# Patient Record
Sex: Male | Born: 1935 | Race: Black or African American | Hispanic: No
Health system: Southern US, Community
[De-identification: ages and names within clinical notes are randomized; demographics above are authoritative.]

## PROBLEM LIST (undated history)

## (undated) DIAGNOSIS — J189 Pneumonia, unspecified organism: Secondary | ICD-10-CM

## (undated) DIAGNOSIS — J449 Chronic obstructive pulmonary disease, unspecified: Secondary | ICD-10-CM

## (undated) DIAGNOSIS — N183 Chronic kidney disease, stage 3 unspecified: Secondary | ICD-10-CM

## (undated) DIAGNOSIS — R001 Bradycardia, unspecified: Secondary | ICD-10-CM

## (undated) DIAGNOSIS — J9621 Acute and chronic respiratory failure with hypoxia: Secondary | ICD-10-CM

## (undated) DIAGNOSIS — M4624 Osteomyelitis of vertebra, thoracic region: Secondary | ICD-10-CM

---

## 2006-07-05 ENCOUNTER — Encounter: Payer: Self-pay | Admitting: Orthopedic Surgery

## 2007-01-19 ENCOUNTER — Ambulatory Visit: Payer: Self-pay | Admitting: Orthopedic Surgery

## 2007-01-19 DIAGNOSIS — M2350 Chronic instability of knee, unspecified knee: Secondary | ICD-10-CM

## 2007-01-19 DIAGNOSIS — Z8679 Personal history of other diseases of the circulatory system: Secondary | ICD-10-CM | POA: Insufficient documentation

## 2007-02-01 ENCOUNTER — Encounter: Payer: Self-pay | Admitting: Orthopedic Surgery

## 2007-03-04 ENCOUNTER — Encounter: Payer: Self-pay | Admitting: Orthopedic Surgery

## 2018-07-25 ENCOUNTER — Other Ambulatory Visit (HOSPITAL_COMMUNITY): Payer: Self-pay

## 2018-07-25 ENCOUNTER — Inpatient Hospital Stay
Admission: AD | Admit: 2018-07-25 | Discharge: 2018-08-22 | Disposition: A | Discharge status: Hospice | Payer: Medicare Other | Source: Other Acute Inpatient Hospital | Attending: Internal Medicine | Admitting: Internal Medicine

## 2018-07-25 DIAGNOSIS — J9621 Acute and chronic respiratory failure with hypoxia: Secondary | ICD-10-CM | POA: Diagnosis present

## 2018-07-25 DIAGNOSIS — Z9289 Personal history of other medical treatment: Secondary | ICD-10-CM

## 2018-07-25 DIAGNOSIS — J984 Other disorders of lung: Secondary | ICD-10-CM

## 2018-07-25 DIAGNOSIS — R001 Bradycardia, unspecified: Secondary | ICD-10-CM | POA: Diagnosis present

## 2018-07-25 DIAGNOSIS — J9690 Respiratory failure, unspecified, unspecified whether with hypoxia or hypercapnia: Secondary | ICD-10-CM

## 2018-07-25 DIAGNOSIS — J189 Pneumonia, unspecified organism: Secondary | ICD-10-CM | POA: Diagnosis present

## 2018-07-25 DIAGNOSIS — Z4659 Encounter for fitting and adjustment of other gastrointestinal appliance and device: Secondary | ICD-10-CM

## 2018-07-25 DIAGNOSIS — N183 Chronic kidney disease, stage 3 unspecified: Secondary | ICD-10-CM | POA: Diagnosis present

## 2018-07-25 DIAGNOSIS — Z931 Gastrostomy status: Secondary | ICD-10-CM

## 2018-07-25 DIAGNOSIS — Z9911 Dependence on respirator [ventilator] status: Secondary | ICD-10-CM

## 2018-07-25 DIAGNOSIS — M4624 Osteomyelitis of vertebra, thoracic region: Secondary | ICD-10-CM | POA: Diagnosis present

## 2018-07-25 DIAGNOSIS — J449 Chronic obstructive pulmonary disease, unspecified: Secondary | ICD-10-CM | POA: Diagnosis present

## 2018-07-25 HISTORY — DX: Chronic kidney disease, stage 3 unspecified: N18.30

## 2018-07-25 HISTORY — DX: Osteomyelitis of vertebra, thoracic region: M46.24

## 2018-07-25 HISTORY — DX: Acute and chronic respiratory failure with hypoxia: J96.21

## 2018-07-25 HISTORY — DX: Bradycardia, unspecified: R00.1

## 2018-07-25 HISTORY — DX: Chronic obstructive pulmonary disease, unspecified: J44.9

## 2018-07-25 HISTORY — DX: Pneumonia, unspecified organism: J18.9

## 2018-07-25 LAB — COMPREHENSIVE METABOLIC PANEL
ALT: 15 U/L (ref 0–44)
AST: 18 U/L (ref 15–41)
Albumin: 2.2 g/dL — ABNORMAL LOW (ref 3.5–5.0)
Alkaline Phosphatase: 50 U/L (ref 38–126)
Anion gap: 7 (ref 5–15)
BUN: 11 mg/dL (ref 8–23)
CO2: 21 mmol/L — ABNORMAL LOW (ref 22–32)
Calcium: 9.2 mg/dL (ref 8.9–10.3)
Chloride: 111 mmol/L (ref 98–111)
Creatinine, Ser: 1.44 mg/dL — ABNORMAL HIGH (ref 0.61–1.24)
GFR calc Af Amer: 50 mL/min — ABNORMAL LOW (ref >60)
GFR calc non Af Amer: 43 mL/min — ABNORMAL LOW (ref >60)
Glucose, Bld: 80 mg/dL (ref 70–99)
Potassium: 5.1 mmol/L (ref 3.5–5.1)
Sodium: 139 mmol/L (ref 135–145)
Total Bilirubin: 0.6 mg/dL (ref 0.3–1.2)
Total Protein: 5.5 g/dL — ABNORMAL LOW (ref 6.5–8.1)

## 2018-07-26 LAB — CBC WITH DIFFERENTIAL/PLATELET
Abs Immature Granulocytes: 0.04 10*3/uL (ref 0.00–0.07)
Basophils Absolute: 0 10*3/uL (ref 0.0–0.1)
Basophils Relative: 0 %
Eosinophils Absolute: 0.2 10*3/uL (ref 0.0–0.5)
Eosinophils Relative: 4 %
HCT: 32.2 % — ABNORMAL LOW (ref 39.0–52.0)
Hemoglobin: 10.5 g/dL — ABNORMAL LOW (ref 13.0–17.0)
Immature Granulocytes: 1 %
Lymphocytes Relative: 32 %
Lymphs Abs: 1.7 10*3/uL (ref 0.7–4.0)
MCH: 33 pg (ref 26.0–34.0)
MCHC: 32.6 g/dL (ref 30.0–36.0)
MCV: 101.3 fL — ABNORMAL HIGH (ref 80.0–100.0)
Monocytes Absolute: 0.5 10*3/uL (ref 0.1–1.0)
Monocytes Relative: 10 %
Neutro Abs: 2.8 10*3/uL (ref 1.7–7.7)
Neutrophils Relative %: 53 %
Platelets: 85 10*3/uL — ABNORMAL LOW (ref 150–400)
RBC: 3.18 MIL/uL — ABNORMAL LOW (ref 4.22–5.81)
RDW: 18.2 % — ABNORMAL HIGH (ref 11.5–15.5)
WBC: 5.2 10*3/uL (ref 4.0–10.5)
nRBC: 0 % (ref 0.0–0.2)

## 2018-07-26 LAB — TSH: TSH: 9.096 u[IU]/mL — ABNORMAL HIGH (ref 0.350–4.500)

## 2018-07-26 LAB — PHOSPHORUS: Phosphorus: 3.3 mg/dL (ref 2.5–4.6)

## 2018-07-26 LAB — PROTIME-INR
INR: 1.1 (ref 0.8–1.2)
Prothrombin Time: 14.2 seconds (ref 11.4–15.2)

## 2018-07-26 LAB — HEMOGLOBIN A1C
Hgb A1c MFr Bld: 5.6 % (ref 4.8–5.6)
Mean Plasma Glucose: 114.02 mg/dL

## 2018-07-26 LAB — MAGNESIUM: Magnesium: 1.6 mg/dL — ABNORMAL LOW (ref 1.7–2.4)

## 2018-07-26 LAB — VANCOMYCIN, TROUGH: Vancomycin Tr: 17 ug/mL (ref 15–20)

## 2018-07-27 LAB — RENAL FUNCTION PANEL
Albumin: 2 g/dL — ABNORMAL LOW (ref 3.5–5.0)
Anion gap: 7 (ref 5–15)
BUN: 18 mg/dL (ref 8–23)
CO2: 23 mmol/L (ref 22–32)
Calcium: 9.1 mg/dL (ref 8.9–10.3)
Chloride: 108 mmol/L (ref 98–111)
Creatinine, Ser: 1.24 mg/dL (ref 0.61–1.24)
GFR calc Af Amer: 59 mL/min — ABNORMAL LOW (ref >60)
GFR calc non Af Amer: 51 mL/min — ABNORMAL LOW (ref >60)
Glucose, Bld: 83 mg/dL (ref 70–99)
Phosphorus: 3.5 mg/dL (ref 2.5–4.6)
Potassium: 4.9 mmol/L (ref 3.5–5.1)
Sodium: 138 mmol/L (ref 135–145)

## 2018-07-27 LAB — CBC
HCT: 29.1 % — ABNORMAL LOW (ref 39.0–52.0)
Hemoglobin: 9.7 g/dL — ABNORMAL LOW (ref 13.0–17.0)
MCH: 33 pg (ref 26.0–34.0)
MCHC: 33.3 g/dL (ref 30.0–36.0)
MCV: 99 fL (ref 80.0–100.0)
Platelets: 100 10*3/uL — ABNORMAL LOW (ref 150–400)
RBC: 2.94 MIL/uL — ABNORMAL LOW (ref 4.22–5.81)
RDW: 18 % — ABNORMAL HIGH (ref 11.5–15.5)
WBC: 4.8 10*3/uL (ref 4.0–10.5)
nRBC: 0 % (ref 0.0–0.2)

## 2018-07-27 LAB — MAGNESIUM: Magnesium: 2 mg/dL (ref 1.7–2.4)

## 2018-07-30 ENCOUNTER — Other Ambulatory Visit (HOSPITAL_COMMUNITY): Payer: Self-pay

## 2018-08-01 LAB — RENAL FUNCTION PANEL
Albumin: 2.4 g/dL — ABNORMAL LOW (ref 3.5–5.0)
Anion gap: 11 (ref 5–15)
BUN: 31 mg/dL — ABNORMAL HIGH (ref 8–23)
CO2: 26 mmol/L (ref 22–32)
Calcium: 9.1 mg/dL (ref 8.9–10.3)
Chloride: 102 mmol/L (ref 98–111)
Creatinine, Ser: 1.3 mg/dL — ABNORMAL HIGH (ref 0.61–1.24)
GFR calc Af Amer: 56 mL/min — ABNORMAL LOW (ref >60)
GFR calc non Af Amer: 48 mL/min — ABNORMAL LOW (ref >60)
Glucose, Bld: 99 mg/dL (ref 70–99)
Phosphorus: 3.2 mg/dL (ref 2.5–4.6)
Potassium: 4.2 mmol/L (ref 3.5–5.1)
Sodium: 139 mmol/L (ref 135–145)

## 2018-08-01 LAB — CBC
HCT: 32.2 % — ABNORMAL LOW (ref 39.0–52.0)
Hemoglobin: 10.6 g/dL — ABNORMAL LOW (ref 13.0–17.0)
MCH: 32.7 pg (ref 26.0–34.0)
MCHC: 32.9 g/dL (ref 30.0–36.0)
MCV: 99.4 fL (ref 80.0–100.0)
Platelets: 143 10*3/uL — ABNORMAL LOW (ref 150–400)
RBC: 3.24 MIL/uL — ABNORMAL LOW (ref 4.22–5.81)
RDW: 17.3 % — ABNORMAL HIGH (ref 11.5–15.5)
WBC: 10.3 10*3/uL (ref 4.0–10.5)
nRBC: 0 % (ref 0.0–0.2)

## 2018-08-01 LAB — MAGNESIUM: Magnesium: 2 mg/dL (ref 1.7–2.4)

## 2018-08-02 ENCOUNTER — Other Ambulatory Visit (HOSPITAL_COMMUNITY): Payer: Self-pay

## 2018-08-04 LAB — BASIC METABOLIC PANEL
Anion gap: 9 (ref 5–15)
BUN: 43 mg/dL — ABNORMAL HIGH (ref 8–23)
CO2: 28 mmol/L (ref 22–32)
Calcium: 9 mg/dL (ref 8.9–10.3)
Chloride: 103 mmol/L (ref 98–111)
Creatinine, Ser: 1.5 mg/dL — ABNORMAL HIGH (ref 0.61–1.24)
GFR calc Af Amer: 47 mL/min — ABNORMAL LOW (ref >60)
GFR calc non Af Amer: 41 mL/min — ABNORMAL LOW (ref >60)
Glucose, Bld: 67 mg/dL — ABNORMAL LOW (ref 70–99)
Potassium: 3.9 mmol/L (ref 3.5–5.1)
Sodium: 140 mmol/L (ref 135–145)

## 2018-08-05 LAB — BASIC METABOLIC PANEL
Anion gap: 9 (ref 5–15)
BUN: 40 mg/dL — ABNORMAL HIGH (ref 8–23)
CO2: 28 mmol/L (ref 22–32)
Calcium: 8.9 mg/dL (ref 8.9–10.3)
Chloride: 105 mmol/L (ref 98–111)
Creatinine, Ser: 1.28 mg/dL — ABNORMAL HIGH (ref 0.61–1.24)
GFR calc Af Amer: 57 mL/min — ABNORMAL LOW (ref >60)
GFR calc non Af Amer: 49 mL/min — ABNORMAL LOW (ref >60)
Glucose, Bld: 114 mg/dL — ABNORMAL HIGH (ref 70–99)
Potassium: 3.2 mmol/L — ABNORMAL LOW (ref 3.5–5.1)
Sodium: 142 mmol/L (ref 135–145)

## 2018-08-05 LAB — CBC
HCT: 30.5 % — ABNORMAL LOW (ref 39.0–52.0)
Hemoglobin: 10.1 g/dL — ABNORMAL LOW (ref 13.0–17.0)
MCH: 33.3 pg (ref 26.0–34.0)
MCHC: 33.1 g/dL (ref 30.0–36.0)
MCV: 100.7 fL — ABNORMAL HIGH (ref 80.0–100.0)
Platelets: 151 10*3/uL (ref 150–400)
RBC: 3.03 MIL/uL — ABNORMAL LOW (ref 4.22–5.81)
RDW: 17.4 % — ABNORMAL HIGH (ref 11.5–15.5)
WBC: 5.8 10*3/uL (ref 4.0–10.5)
nRBC: 0 % (ref 0.0–0.2)

## 2018-08-05 LAB — MAGNESIUM: Magnesium: 2 mg/dL (ref 1.7–2.4)

## 2018-08-06 LAB — POTASSIUM: Potassium: 3.6 mmol/L (ref 3.5–5.1)

## 2018-08-06 LAB — TSH: TSH: 6.643 u[IU]/mL — ABNORMAL HIGH (ref 0.350–4.500)

## 2018-08-08 ENCOUNTER — Other Ambulatory Visit (HOSPITAL_COMMUNITY): Payer: Self-pay

## 2018-08-08 LAB — CBC
HCT: 34.6 % — ABNORMAL LOW (ref 39.0–52.0)
HCT: 29.3 % — ABNORMAL LOW (ref 39.0–52.0)
Hemoglobin: 11.7 g/dL — ABNORMAL LOW (ref 13.0–17.0)
Hemoglobin: 9.5 g/dL — ABNORMAL LOW (ref 13.0–17.0)
MCH: 33.6 pg (ref 26.0–34.0)
MCH: 33 pg (ref 26.0–34.0)
MCHC: 33.8 g/dL (ref 30.0–36.0)
MCHC: 32.4 g/dL (ref 30.0–36.0)
MCV: 99.4 fL (ref 80.0–100.0)
MCV: 101.7 fL — ABNORMAL HIGH (ref 80.0–100.0)
Platelets: 172 10*3/uL (ref 150–400)
Platelets: 165 10*3/uL (ref 150–400)
RBC: 3.48 MIL/uL — ABNORMAL LOW (ref 4.22–5.81)
RBC: 2.88 MIL/uL — ABNORMAL LOW (ref 4.22–5.81)
RDW: 17.5 % — ABNORMAL HIGH (ref 11.5–15.5)
RDW: 17.5 % — ABNORMAL HIGH (ref 11.5–15.5)
WBC: 5.7 10*3/uL (ref 4.0–10.5)
WBC: 5 10*3/uL (ref 4.0–10.5)
nRBC: 0 % (ref 0.0–0.2)
nRBC: 0 % (ref 0.0–0.2)

## 2018-08-08 LAB — MAGNESIUM
Magnesium: 1.8 mg/dL (ref 1.7–2.4)
Magnesium: 1.9 mg/dL (ref 1.7–2.4)

## 2018-08-08 LAB — CK: Total CK: 79 U/L (ref 49–397)

## 2018-08-08 LAB — BASIC METABOLIC PANEL
Anion gap: 10 (ref 5–15)
Anion gap: 9 (ref 5–15)
BUN: 23 mg/dL (ref 8–23)
BUN: 26 mg/dL — ABNORMAL HIGH (ref 8–23)
CO2: 27 mmol/L (ref 22–32)
CO2: 26 mmol/L (ref 22–32)
Calcium: 9.1 mg/dL (ref 8.9–10.3)
Calcium: 8.7 mg/dL — ABNORMAL LOW (ref 8.9–10.3)
Chloride: 104 mmol/L (ref 98–111)
Chloride: 105 mmol/L (ref 98–111)
Creatinine, Ser: 1.06 mg/dL (ref 0.61–1.24)
Creatinine, Ser: 1.33 mg/dL — ABNORMAL HIGH (ref 0.61–1.24)
GFR calc Af Amer: >60 mL/min (ref >60)
GFR calc Af Amer: 55 mL/min — ABNORMAL LOW (ref >60)
GFR calc non Af Amer: >60 mL/min (ref >60)
GFR calc non Af Amer: 47 mL/min — ABNORMAL LOW (ref >60)
Glucose, Bld: 149 mg/dL — ABNORMAL HIGH (ref 70–99)
Glucose, Bld: 166 mg/dL — ABNORMAL HIGH (ref 70–99)
Potassium: 3.7 mmol/L (ref 3.5–5.1)
Potassium: 4 mmol/L (ref 3.5–5.1)
Sodium: 141 mmol/L (ref 135–145)
Sodium: 140 mmol/L (ref 135–145)

## 2018-08-08 LAB — TROPONIN I (HIGH SENSITIVITY): Troponin I (High Sensitivity): 24 ng/L — ABNORMAL HIGH (ref <18)

## 2018-08-08 MED FILL — Medication: Qty: 1 | Status: AC

## 2018-08-09 ENCOUNTER — Encounter: Payer: Self-pay | Admitting: Internal Medicine

## 2018-08-09 ENCOUNTER — Institutional Professional Consult (permissible substitution) (HOSPITAL_COMMUNITY): Payer: Self-pay

## 2018-08-09 ENCOUNTER — Other Ambulatory Visit (HOSPITAL_COMMUNITY): Payer: Self-pay

## 2018-08-09 DIAGNOSIS — M4624 Osteomyelitis of vertebra, thoracic region: Secondary | ICD-10-CM

## 2018-08-09 DIAGNOSIS — R001 Bradycardia, unspecified: Secondary | ICD-10-CM | POA: Diagnosis not present

## 2018-08-09 DIAGNOSIS — N183 Chronic kidney disease, stage 3 unspecified: Secondary | ICD-10-CM | POA: Diagnosis present

## 2018-08-09 DIAGNOSIS — J9621 Acute and chronic respiratory failure with hypoxia: Secondary | ICD-10-CM | POA: Diagnosis not present

## 2018-08-09 DIAGNOSIS — J189 Pneumonia, unspecified organism: Secondary | ICD-10-CM

## 2018-08-09 DIAGNOSIS — J449 Chronic obstructive pulmonary disease, unspecified: Secondary | ICD-10-CM

## 2018-08-09 LAB — CBC
HCT: 29.9 % — ABNORMAL LOW (ref 39.0–52.0)
HCT: 28.7 % — ABNORMAL LOW (ref 39.0–52.0)
Hemoglobin: 9.9 g/dL — ABNORMAL LOW (ref 13.0–17.0)
Hemoglobin: 9.1 g/dL — ABNORMAL LOW (ref 13.0–17.0)
MCH: 33.6 pg (ref 26.0–34.0)
MCH: 33.1 pg (ref 26.0–34.0)
MCHC: 33.1 g/dL (ref 30.0–36.0)
MCHC: 31.7 g/dL (ref 30.0–36.0)
MCV: 101.4 fL — ABNORMAL HIGH (ref 80.0–100.0)
MCV: 104.4 fL — ABNORMAL HIGH (ref 80.0–100.0)
Platelets: 185 10*3/uL (ref 150–400)
Platelets: 153 10*3/uL (ref 150–400)
RBC: 2.95 MIL/uL — ABNORMAL LOW (ref 4.22–5.81)
RBC: 2.75 MIL/uL — ABNORMAL LOW (ref 4.22–5.81)
RDW: 18 % — ABNORMAL HIGH (ref 11.5–15.5)
RDW: 18 % — ABNORMAL HIGH (ref 11.5–15.5)
WBC: 6.1 10*3/uL (ref 4.0–10.5)
WBC: 6.8 10*3/uL (ref 4.0–10.5)
nRBC: 0 % (ref 0.0–0.2)
nRBC: 0 % (ref 0.0–0.2)

## 2018-08-09 LAB — RENAL FUNCTION PANEL
Albumin: 2.4 g/dL — ABNORMAL LOW (ref 3.5–5.0)
Albumin: 2.3 g/dL — ABNORMAL LOW (ref 3.5–5.0)
Anion gap: 11 (ref 5–15)
Anion gap: 9 (ref 5–15)
BUN: 37 mg/dL — ABNORMAL HIGH (ref 8–23)
BUN: 41 mg/dL — ABNORMAL HIGH (ref 8–23)
CO2: 23 mmol/L (ref 22–32)
CO2: 25 mmol/L (ref 22–32)
Calcium: 8.7 mg/dL — ABNORMAL LOW (ref 8.9–10.3)
Calcium: 8.6 mg/dL — ABNORMAL LOW (ref 8.9–10.3)
Chloride: 106 mmol/L (ref 98–111)
Chloride: 106 mmol/L (ref 98–111)
Creatinine, Ser: 1.59 mg/dL — ABNORMAL HIGH (ref 0.61–1.24)
Creatinine, Ser: 1.73 mg/dL — ABNORMAL HIGH (ref 0.61–1.24)
GFR calc Af Amer: 44 mL/min — ABNORMAL LOW (ref >60)
GFR calc Af Amer: 40 mL/min — ABNORMAL LOW (ref >60)
GFR calc non Af Amer: 38 mL/min — ABNORMAL LOW (ref >60)
GFR calc non Af Amer: 34 mL/min — ABNORMAL LOW (ref >60)
Glucose, Bld: 164 mg/dL — ABNORMAL HIGH (ref 70–99)
Glucose, Bld: 167 mg/dL — ABNORMAL HIGH (ref 70–99)
Phosphorus: 3.7 mg/dL (ref 2.5–4.6)
Phosphorus: 3.7 mg/dL (ref 2.5–4.6)
Potassium: 5.1 mmol/L (ref 3.5–5.1)
Potassium: 4 mmol/L (ref 3.5–5.1)
Sodium: 140 mmol/L (ref 135–145)
Sodium: 140 mmol/L (ref 135–145)

## 2018-08-09 LAB — PROTIME-INR
INR: 1.2 (ref 0.8–1.2)
Prothrombin Time: 15.1 seconds (ref 11.4–15.2)

## 2018-08-09 LAB — URINALYSIS, ROUTINE W REFLEX MICROSCOPIC
Bilirubin Urine: NEGATIVE
Glucose, UA: NEGATIVE mg/dL
Hgb urine dipstick: NEGATIVE
Ketones, ur: NEGATIVE mg/dL
Nitrite: NEGATIVE
Protein, ur: 30 mg/dL — AB
Specific Gravity, Urine: 1.015 (ref 1.005–1.030)
WBC, UA: >50 WBC/hpf — ABNORMAL HIGH (ref 0–5)
pH: 6 (ref 5.0–8.0)

## 2018-08-09 LAB — LACTIC ACID, PLASMA
Lactic Acid, Venous: 2 mmol/L (ref 0.5–1.9)
Lactic Acid, Venous: 2.1 mmol/L (ref 0.5–1.9)
Lactic Acid, Venous: 2.8 mmol/L (ref 0.5–1.9)
Lactic Acid, Venous: 2 mmol/L (ref 0.5–1.9)

## 2018-08-09 LAB — APTT: aPTT: 42 s — ABNORMAL HIGH (ref 24–36)

## 2018-08-09 LAB — TROPONIN I (HIGH SENSITIVITY)
Troponin I (High Sensitivity): 35 ng/L — ABNORMAL HIGH (ref <18)
Troponin I (High Sensitivity): 31 ng/L — ABNORMAL HIGH (ref <18)
Troponin I (High Sensitivity): 28 ng/L — ABNORMAL HIGH (ref <18)

## 2018-08-09 LAB — MAGNESIUM
Magnesium: 2 mg/dL (ref 1.7–2.4)
Magnesium: 2 mg/dL (ref 1.7–2.4)

## 2018-08-09 LAB — HEPARIN LEVEL (UNFRACTIONATED)
Heparin Unfractionated: 0.23 [IU]/mL — ABNORMAL LOW (ref 0.30–0.70)
Heparin Unfractionated: 0.64 IU/mL (ref 0.30–0.70)

## 2018-08-09 LAB — CK: Total CK: 111 U/L (ref 49–397)

## 2018-08-09 NOTE — Progress Notes (Signed)
Echo attempted. Non diagnostic due to patient body habitus. Nurse notified.

## 2018-08-09 NOTE — Consult Note (Signed)
Pulmonary [place]  PULMONARY SERVICE  Date of Service: 08/09/2018  PULMONARY CRITICAL CARE CONSULT   Fernando Valdez  NTI:[id number]  DOB: [birth year]   DOA: 07/25/2018  Referring Physician: Merton Border, MD  HPI: Fernando Valdez is a 85 y.o. male seen for follow up of Acute on Chronic Respiratory Failure.  Patient is multiple medical problems including COPD stroke hypertension type 2 diabetes chronic renal failure presented originally to the hospital with hypotension bradycardia confusion and altered mental status.  At that time initial evaluation patient was found to be septic shock patient was started on levo fed as well as pressors eventually was able to wean off the pressors and was transferred to our facility for further management.  Overnight patient subsequently had a rapid response #7 bradycardia now is intubated on the ventilator patient is assist control right now on 30% FiO2.  Review of Systems:  ROS performed and is unremarkable other than noted above.  Past medical history: COPD Stroke Hypertension Type 2 diabetes Chronic renal failure Bradycardia Hypotension Sepsis Acute  Past surgical history: Patient is not able to provide  Allergies: No known drug allergies reported  Family history: No significant family history  Medications: Reviewed on Rounds  Physical Exam:  Vitals: Temperature 98.4 pulse 84 respiratory rate 17 blood pressure is 129/51 saturations 100%  Ventilator Settings mode ventilation assist control FiO2 30% tidal volume 481 PEEP 5  . General: Comfortable at this time . Eyes: Grossly normal lids, irises & conjunctiva . ENT: grossly tongue is normal . Neck: no obvious mass . Cardiovascular: S1-S2 normal no gallop or rub . Respiratory: No rhonchi no rales are noted at this time . Abdomen: Soft nontender . Skin: no rash seen on limited exam . Musculoskeletal: not rigid . Psychiatric:unable to  assess . Neurologic: no seizure no involuntary movements         Labs on Admission:  Basic Metabolic Panel: Recent Labs  Lab 08/04/18 0609 08/05/18 0654 08/06/18 0630 08/08/18 0843 08/08/18 1501 08/09/18 0103  NA 140 142  --  141 140 140  K 3.9 3.2* 3.6 3.7 4.0 5.1  CL 103 105  --  104 105 106  CO2 28 28  --  27 26 23   GLUCOSE 67* 114*  --  149* 166* 164*  BUN 43* 40*  --  23 26* 37*  CREATININE 1.50* 1.28*  --  1.06 1.33* 1.59*  CALCIUM 9.0 8.9  --  9.1 8.7* 8.7*  MG  --  2.0  --  1.8 1.9 2.0  PHOS  --   --   --   --   --  3.7    Recent Labs  Lab 08/08/18 1528  PHART 7.358  PCO2ART 42.6  PO2ART 143*  HCO3 23.3  O2SAT 98.7    Liver Function Tests: Recent Labs  Lab 08/09/18 0103  ALBUMIN 2.4*   No results for input(s): LIPASE, AMYLASE in the last 168 hours. No results for input(s): AMMONIA in the last 168 hours.  CBC: Recent Labs  Lab 08/05/18 0654 08/08/18 0843 08/08/18 1501 08/09/18 0103  WBC 5.8 5.7 5.0 6.1  HGB 10.1* 11.7* 9.5* 9.9*  HCT 30.5* 34.6* 29.3* 29.9*  MCV 100.7* 99.4 101.7* 101.4*  PLT 151 172 165 185    Cardiac Enzymes: Recent Labs  Lab 08/08/18 2141  CKTOTAL 79    BNP (last 3 results) No results for input(s): BNP in the last 8760 hours.  ProBNP (last 3 results) No  results for input(s): PROBNP in the last 8760 hours.   Radiological Exams on Admission: Dg Abd 1 View  Result Date: 08/08/2018 CLINICAL DATA:  Nasogastric tube placement EXAM: ABDOMEN - 1 VIEW COMPARISON:  Radiograph 07/30/2018. FINDINGS: Transesophageal tube tip and side port distal to the GE junction. Additional support devices overlie the chest. Right basilar atelectasis is noted. Numerous air distended loops of bowel are seen in the abdomen. Radiodense material is seen in the descending colon. Degenerative changes are present in the spine. IMPRESSION: Appropriate positioning of the nasogastric tube. Marked gaseous distention of the bowel. Favored to reflect ileus  versus obstruction given retained contrast medium within the left colon. Electronically Signed   By: Kreg ShropshirePrice  DeHay M.D.   On: 08/08/2018 15:51   Ct Head Wo Contrast  Result Date: 08/08/2018 CLINICAL DATA:  Altered mental status. EXAM: CT HEAD WITHOUT CONTRAST TECHNIQUE: Contiguous axial images were obtained from the base of the skull through the vertex without intravenous contrast. COMPARISON:  None. FINDINGS: Brain: There is no evidence of acute intracranial hemorrhage, mass lesion, brain edema or extra-axial fluid collection. There is mild atrophy with mild prominence of the ventricles and subarachnoid spaces. There are chronic small vessel ischemic changes in the periventricular and subcortical white matter bilaterally with an old lacunar infarct in the left thalamus. There is no CT evidence of acute cortical infarction. Vascular:  No hyperdense vessel identified. Skull: Negative for fracture or focal lesion. Sinuses/Orbits: There is a mucous retention cyst or polyp inferiorly in the right maxillary sinus. The visualized paranasal sinuses, mastoid air cells and middle ears are otherwise clear. No significant orbital findings. Other: There is widening of the predental space associated with anterior translation of C1 with respect to the odontoid process. There is synovial thickening surrounding the odontoid process, and this significantly narrows the AP diameter of the canal at the craniocervical junction. This may compress the proximal cervical cord. This area is incompletely visualized on this examination the head. IMPRESSION: 1. No acute intracranial findings are identified. There is mild atrophy and chronic small vessel ischemic changes. 2. Widening of the predental space associated with synovial thickening around the odontoid process and implying atlantoaxial instability. There is narrowing of the AP diameter of the upper spinal canal with possible compression of the proximal cervical cord. This is  potentially unstable. For initial evaluation, further evaluation with CT of the cervical spine recommended. 3. These results will be called to the ordering clinician or representative by the Radiologist Assistant, and communication documented in the PACS or zVision Dashboard. Electronically Signed   By: Carey BullocksWilliam  Veazey M.D.   On: 08/08/2018 19:06   Dg Chest Port 1 View  Result Date: 08/08/2018 CLINICAL DATA:  Endotracheal tube placement. EXAM: PORTABLE CHEST 1 VIEW COMPARISON:  Radiograph July 25, 2018 FINDINGS: Endotracheal tube tip is appropriately positioned in the mid trachea. A transesophageal tube appears curled in the mid esophagus, with the tip and side-port directed superiorly. Bandlike area of atelectasis in the right lung base. Lungs are otherwise clear. No pneumothorax or effusion. Soft tissues and osseous structures are otherwise unremarkable. Additional support devices are coiled over the chest. Notable gaseous distension of the abdominal bowel loops. IMPRESSION: Appropriate positioning of the endotracheal tube. Transesophageal tube is coiled in the esophagus and now directed superiorly. Per EMR note, transesophageal tube was immediately repositioned Right basilar atelectasis, otherwise no acute cardiopulmonary. Electronically Signed   By: Kreg ShropshirePrice  DeHay M.D.   On: 08/08/2018 15:48    Assessment/Plan Active Problems:  Acute on chronic respiratory failure with hypoxia (HCC)   Healthcare-associated pneumonia   Chronic osteomyelitis of thoracic spine (HCC)   Bradycardia   Chronic kidney disease, stage III (moderate) (HCC)   COPD, severe (HCC)   1. Acute on chronic respiratory failure with hypoxia at this time patient is orally intubated.  Right now remains on the ventilator and full support.  On the present settings appears to be comfortable which we will continue for now. 2. Healthcare associated acute associated pneumonia patient had been diagnosed with healthcare associated pneumonia  has been treated with antibiotics currently with a chest x-ray that was done postintubation revealed some basilar atelectasis no acute infiltrate noted we will continue to follow. 3. 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  There is now some abnormality noted in the cervical area and a CT scan of the cervical spine has been ordered for further evaluation. 4. Bradycardia he is on telemetry we will going to continue to monitor.  Consider cardiology consultation follow-up. 5. Acute on kidney disease stage III we will follow-up on labs continue with supportive care 6. COPD continue with present management nebulizers as necessary  I have personally seen and evaluated the patient, evaluated laboratory and imaging results, formulated the assessment and plan and placed orders.  Patient is critically ill in danger of cardiac arrest and death needs high intensity monitoring patient remains orally intubated The Patient requires high complexity decision making for assessment and support.  Case was discussed on Rounds with the Respiratory Therapy Staff Time Spent 70minutes  Yevonne PaxSaadat A Ronniesha Seibold, MD [facility] Pulmonary Critical Care Medicine Sleep Medicine

## 2018-08-10 ENCOUNTER — Other Ambulatory Visit (HOSPITAL_COMMUNITY): Payer: Self-pay

## 2018-08-10 ENCOUNTER — Institutional Professional Consult (permissible substitution) (HOSPITAL_COMMUNITY): Payer: Self-pay

## 2018-08-10 DIAGNOSIS — N183 Chronic kidney disease, stage 3 (moderate): Secondary | ICD-10-CM | POA: Diagnosis not present

## 2018-08-10 DIAGNOSIS — R001 Bradycardia, unspecified: Secondary | ICD-10-CM | POA: Diagnosis not present

## 2018-08-10 DIAGNOSIS — J9621 Acute and chronic respiratory failure with hypoxia: Secondary | ICD-10-CM | POA: Diagnosis not present

## 2018-08-10 DIAGNOSIS — M4624 Osteomyelitis of vertebra, thoracic region: Secondary | ICD-10-CM | POA: Diagnosis not present

## 2018-08-10 LAB — HEMOGLOBIN AND HEMATOCRIT, BLOOD
HCT: 33.1 % — ABNORMAL LOW (ref 39.0–52.0)
Hemoglobin: 10.4 g/dL — ABNORMAL LOW (ref 13.0–17.0)

## 2018-08-10 LAB — RENAL FUNCTION PANEL
Albumin: 2.4 g/dL — ABNORMAL LOW (ref 3.5–5.0)
Albumin: 2.1 g/dL — ABNORMAL LOW (ref 3.5–5.0)
Anion gap: 13 (ref 5–15)
Anion gap: 11 (ref 5–15)
BUN: 33 mg/dL — ABNORMAL HIGH (ref 8–23)
BUN: 30 mg/dL — ABNORMAL HIGH (ref 8–23)
CO2: 23 mmol/L (ref 22–32)
CO2: 20 mmol/L — ABNORMAL LOW (ref 22–32)
Calcium: 8.6 mg/dL — ABNORMAL LOW (ref 8.9–10.3)
Calcium: 8.5 mg/dL — ABNORMAL LOW (ref 8.9–10.3)
Chloride: 106 mmol/L (ref 98–111)
Chloride: 109 mmol/L (ref 98–111)
Creatinine, Ser: 1.34 mg/dL — ABNORMAL HIGH (ref 0.61–1.24)
Creatinine, Ser: 1.16 mg/dL (ref 0.61–1.24)
GFR calc Af Amer: 54 mL/min — ABNORMAL LOW (ref >60)
GFR calc Af Amer: >60 mL/min (ref >60)
GFR calc non Af Amer: 47 mL/min — ABNORMAL LOW (ref >60)
GFR calc non Af Amer: 56 mL/min — ABNORMAL LOW (ref >60)
Glucose, Bld: 157 mg/dL — ABNORMAL HIGH (ref 70–99)
Glucose, Bld: 139 mg/dL — ABNORMAL HIGH (ref 70–99)
Phosphorus: 2.9 mg/dL (ref 2.5–4.6)
Phosphorus: 2.8 mg/dL (ref 2.5–4.6)
Potassium: 3.9 mmol/L (ref 3.5–5.1)
Potassium: 3.5 mmol/L (ref 3.5–5.1)
Sodium: 142 mmol/L (ref 135–145)
Sodium: 140 mmol/L (ref 135–145)

## 2018-08-10 LAB — CBC
HCT: 21.4 % — ABNORMAL LOW (ref 39.0–52.0)
HCT: 27.1 % — ABNORMAL LOW (ref 39.0–52.0)
Hemoglobin: 7.1 g/dL — ABNORMAL LOW (ref 13.0–17.0)
Hemoglobin: 9 g/dL — ABNORMAL LOW (ref 13.0–17.0)
MCH: 33.5 pg (ref 26.0–34.0)
MCH: 32.6 pg (ref 26.0–34.0)
MCHC: 33.2 g/dL (ref 30.0–36.0)
MCHC: 33.2 g/dL (ref 30.0–36.0)
MCV: 100.9 fL — ABNORMAL HIGH (ref 80.0–100.0)
MCV: 98.2 fL (ref 80.0–100.0)
Platelets: 148 10*3/uL — ABNORMAL LOW (ref 150–400)
Platelets: 121 10*3/uL — ABNORMAL LOW (ref 150–400)
RBC: 2.12 MIL/uL — ABNORMAL LOW (ref 4.22–5.81)
RBC: 2.76 MIL/uL — ABNORMAL LOW (ref 4.22–5.81)
RDW: 17.8 % — ABNORMAL HIGH (ref 11.5–15.5)
RDW: 17.8 % — ABNORMAL HIGH (ref 11.5–15.5)
WBC: 4.6 10*3/uL (ref 4.0–10.5)
WBC: 4.9 10*3/uL (ref 4.0–10.5)
nRBC: 0 % (ref 0.0–0.2)
nRBC: 0 % (ref 0.0–0.2)

## 2018-08-10 LAB — BLOOD GAS, ARTERIAL
Acid-Base Excess: 0.4 mmol/L (ref 0.0–2.0)
Bicarbonate: 24.5 mmol/L (ref 20.0–28.0)
FIO2: 0.24
O2 Content: 1 L/min
O2 Saturation: 97.8 %
Patient temperature: 98.6
pCO2 arterial: 39.4 mmHg (ref 32.0–48.0)
pH, Arterial: 7.411 (ref 7.350–7.450)
pO2, Arterial: 102 mmHg (ref 83.0–108.0)

## 2018-08-10 LAB — MAGNESIUM
Magnesium: 1.8 mg/dL (ref 1.7–2.4)
Magnesium: 1.7 mg/dL (ref 1.7–2.4)

## 2018-08-10 LAB — ABO/RH: ABO/RH(D): O POS

## 2018-08-10 LAB — URINE CULTURE: Culture: NO GROWTH

## 2018-08-10 LAB — LACTIC ACID, PLASMA
Lactic Acid, Venous: 2.9 mmol/L (ref 0.5–1.9)
Lactic Acid, Venous: 3 mmol/L (ref 0.5–1.9)

## 2018-08-10 LAB — CK
Total CK: 89 U/L (ref 49–397)
Total CK: 67 U/L (ref 49–397)

## 2018-08-10 LAB — HEPARIN LEVEL (UNFRACTIONATED): Heparin Unfractionated: 0.24 IU/mL — ABNORMAL LOW (ref 0.30–0.70)

## 2018-08-10 LAB — PREPARE RBC (CROSSMATCH)

## 2018-08-10 LAB — TRIGLYCERIDES: Triglycerides: 40 mg/dL (ref <150)

## 2018-08-10 LAB — TROPONIN I (HIGH SENSITIVITY)
Troponin I (High Sensitivity): 13 ng/L (ref <18)
Troponin I (High Sensitivity): 13 ng/L (ref <18)

## 2018-08-10 NOTE — CV Procedure (Signed)
Echo attempted twice. Non-diagnostic due to patients body habitus. Nurse notified.

## 2018-08-10 NOTE — Progress Notes (Addendum)
[facility]   PULMONARY CRITICAL CARE SERVICE  PROGRESS NOTE  Date of Service: 08/10/2018  Fernando Valdez  ZOX:[id number]  DOB: January 16, 1926   DOA: 07/25/2018  Referring Physician: Carron CurieAli Hijazi, MD  HPI: Fernando Valdez is a [age] y.o. male seen for follow up of Acute on Chronic Respiratory Failure.  Patient remains on room air at this time satting well with no fever or distress noted.  Medications: Reviewed on Rounds  Physical Exam:  Vitals: Pulse 95 respiration 26 BP 1 54-50 O2 sat 99% temp 90.2  Ventilator Settings room air  . General: Comfortable at this time . Eyes: Grossly normal lids, irises & conjunctiva . ENT: grossly tongue is normal . Neck: no obvious mass . Cardiovascular: S1 S2 normal no gallop . Respiratory: No rales or rhonchi noted . Abdomen: soft . Skin: no rash seen on limited exam . Musculoskeletal: not rigid . Psychiatric:unable to assess . Neurologic: no seizure no involuntary movements         Lab Data:   Basic Metabolic Panel: Recent Labs  Lab 08/08/18 1501 08/09/18 0103 08/09/18 1311 08/10/18 0036 08/10/18 1139  NA 140 140 140 142 140  K 4.0 5.1 4.0 3.9 3.5  CL 105 106 106 106 109  CO2 26 23 25 23  20*  GLUCOSE 166* 164* 167* 157* 139*  BUN 26* 37* 41* 33* 30*  CREATININE 1.33* 1.59* 1.73* 1.34* 1.16  CALCIUM 8.7* 8.7* 8.6* 8.6* 8.5*  MG 1.9 2.0 2.0 1.8 1.7  PHOS  --  3.7 3.7 2.9 2.8    ABG: Recent Labs  Lab 08/08/18 1528 08/10/18 0511  PHART 7.358 7.411  PCO2ART 42.6 39.4  PO2ART 143* 102  HCO3 23.3 24.5  O2SAT 98.7 97.8    Liver Function Tests: Recent Labs  Lab 08/09/18 0103 08/09/18 1311 08/10/18 0036 08/10/18 1139  ALBUMIN 2.4* 2.3* 2.4* 2.1*   No results for input(s): LIPASE, AMYLASE in the last 168 hours. No results for input(s): AMMONIA in the last 168 hours.  CBC: Recent Labs  Lab 08/08/18 1501 08/09/18 0103 08/09/18 1311 08/10/18 0036 08/10/18 1037  08/10/18 1139  WBC 5.0 6.1 6.8 4.6  --  4.9  HGB 9.5* 9.9* 9.1* 7.1* 10.4* 9.0*  HCT 29.3* 29.9* 28.7* 21.4* 33.1* 27.1*  MCV 101.7* 101.4* 104.4* 100.9*  --  98.2  PLT 165 185 153 148*  --  121*    Cardiac Enzymes: Recent Labs  Lab 08/08/18 2141 08/09/18 1311 08/10/18 0036 08/10/18 1139  CKTOTAL 79 111 89 67    BNP (last 3 results) No results for input(s): BNP in the last 8760 hours.  ProBNP (last 3 results) No results for input(s): PROBNP in the last 8760 hours.  Radiological Exams: Ct Cervical Spine Wo Contrast  Result Date: 08/09/2018 CLINICAL DATA:  Neck pain. EXAM: CT CERVICAL SPINE WITHOUT CONTRAST TECHNIQUE: Multidetector CT imaging of the cervical spine was performed without intravenous contrast. Multiplanar CT image reconstructions were also generated. COMPARISON:  Head CT from yesterday. FINDINGS: Alignment: Normal alignment of the cervical vertebral bodies. There is reversal of the normal cervical lordosis which could be due to positioning, muscle spasm or pain. Skull base and vertebrae: The skull base C1 articulations are maintained. There are advanced degenerative changes at C1-2 with pannus formation and possible erosions. The soft tissue pannus does creates some mass effect on the upper cervical cord. No acute cervical spine fracture. The facets are normally aligned. No facet or lamina fractures. Soft tissues  and spinal canal: No prevertebral fluid or swelling. No visible canal hematoma. Disc levels: The spinal canal is fairly generous. No significant spinal stenosis below C2-3. Mild multilevel foraminal stenosis due to uncinate spurring and facet disease. No large disc protrusions. Upper chest: Emphysematous changes are noted but no worrisome pulmonary lesions. Other: Endotracheal tube and NG tubes are noted. IMPRESSION: 1. Advanced degenerative changes at C1-2 with pannus formation and mild mass effect on the upper cervical spinal cord. 2. No acute bony findings, abnormal  prevertebral soft tissue swelling or canal stenosis but below C2. Electronically Signed   By: Marijo Sanes M.D.   On: 08/09/2018 16:36   Dg Chest Port 1 View  Result Date: 08/10/2018 CLINICAL DATA:  Self extubation EXAM: PORTABLE CHEST 1 VIEW COMPARISON:  Two days ago FINDINGS: Improved positioning of enteric tube with tip over the proximal stomach. Interval extubation of the trachea. Continued bandlike opacity at the right base partially obscuring the diaphragm. No edema, effusion, or pneumothorax. Normal heart size and aortic contours. IMPRESSION: Tracheal extubation with unchanged right base atelectasis. Electronically Signed   By: Monte Fantasia M.D.   On: 08/10/2018 07:02    Assessment/Plan Active Problems:   Acute on chronic respiratory failure with hypoxia (HCC)   Healthcare-associated pneumonia   Chronic osteomyelitis of thoracic spine (HCC)   Bradycardia   Chronic kidney disease, stage III (moderate) (HCC)   COPD, severe ([place])   1. Acute on chronic respiratory failure with hypoxia patient is extubated on room air at this time satting well with no fever or distress noted. 2. Healthcare associated pneumonia treated with antibiotics will continue to follow. 3. 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 4. Bradycardia at admission, resolved at this time. 5. Acute on chronic kidney disease stage III continue to follow labs and supportive measures 6. COPD continue present management nebulizers as necessary   I have personally seen and evaluated the patient, evaluated laboratory and imaging results, formulated the assessment and plan and placed orders. The Patient requires high complexity decision making for assessment and support.  Case was discussed on Rounds with the Respiratory Therapy Staff  Allyne Gee, MD [facility] Pulmonary Critical Care Medicine Sleep Medicine

## 2018-08-11 DIAGNOSIS — R001 Bradycardia, unspecified: Secondary | ICD-10-CM | POA: Diagnosis not present

## 2018-08-11 DIAGNOSIS — N183 Chronic kidney disease, stage 3 (moderate): Secondary | ICD-10-CM | POA: Diagnosis not present

## 2018-08-11 DIAGNOSIS — J9621 Acute and chronic respiratory failure with hypoxia: Secondary | ICD-10-CM | POA: Diagnosis not present

## 2018-08-11 DIAGNOSIS — M4624 Osteomyelitis of vertebra, thoracic region: Secondary | ICD-10-CM | POA: Diagnosis not present

## 2018-08-11 LAB — CBC
HCT: 26.1 % — ABNORMAL LOW (ref 39.0–52.0)
Hemoglobin: 8.7 g/dL — ABNORMAL LOW (ref 13.0–17.0)
MCH: 33.2 pg (ref 26.0–34.0)
MCHC: 33.3 g/dL (ref 30.0–36.0)
MCV: 99.6 fL (ref 80.0–100.0)
Platelets: 120 10*3/uL — ABNORMAL LOW (ref 150–400)
RBC: 2.62 MIL/uL — ABNORMAL LOW (ref 4.22–5.81)
RDW: 18.3 % — ABNORMAL HIGH (ref 11.5–15.5)
WBC: 4.1 10*3/uL (ref 4.0–10.5)
nRBC: 0 % (ref 0.0–0.2)

## 2018-08-11 LAB — RENAL FUNCTION PANEL
Albumin: 2.1 g/dL — ABNORMAL LOW (ref 3.5–5.0)
Anion gap: 9 (ref 5–15)
BUN: 25 mg/dL — ABNORMAL HIGH (ref 8–23)
CO2: 22 mmol/L (ref 22–32)
Calcium: 8.4 mg/dL — ABNORMAL LOW (ref 8.9–10.3)
Chloride: 108 mmol/L (ref 98–111)
Creatinine, Ser: 1.03 mg/dL (ref 0.61–1.24)
GFR calc Af Amer: >60 mL/min (ref >60)
GFR calc non Af Amer: >60 mL/min (ref >60)
Glucose, Bld: 162 mg/dL — ABNORMAL HIGH (ref 70–99)
Phosphorus: 2.2 mg/dL — ABNORMAL LOW (ref 2.5–4.6)
Potassium: 3.3 mmol/L — ABNORMAL LOW (ref 3.5–5.1)
Sodium: 139 mmol/L (ref 135–145)

## 2018-08-11 LAB — LACTIC ACID, PLASMA: Lactic Acid, Venous: 1.9 mmol/L (ref 0.5–1.9)

## 2018-08-11 LAB — MAGNESIUM: Magnesium: 2.1 mg/dL (ref 1.7–2.4)

## 2018-08-11 LAB — TYPE AND SCREEN
ABO/RH(D): O POS
Antibody Screen: NEGATIVE
Unit division: 0

## 2018-08-11 LAB — BPAM RBC
Blood Product Expiration Date: 202008312359
ISSUE DATE / TIME: 202008050511
Unit Type and Rh: 5100

## 2018-08-11 NOTE — Progress Notes (Addendum)
Pulmonary Critical Care Medicine [place]   PULMONARY CRITICAL CARE SERVICE  PROGRESS NOTE  Date of Service: 08/11/2018  Jcion Buddenhagen  GLO:[id number]  DOB: [birth year]   DOA: 07/25/2018  Referring Physician: Merton Border, MD  HPI: Fernando Valdez is a 85 y.o. male seen for follow up of Acute on Chronic Respiratory Failure.  Patient continues on room air at this time satting well with no fever or distress noted.  Medications: Reviewed on Rounds  Physical Exam:  Vitals: Pulse 75 respirations 22 BP 134/63 O2 sat 98% temp 96.0  Ventilator Settings room air  . General: Comfortable at this time . Eyes: Grossly normal lids, irises & conjunctiva . ENT: grossly tongue is normal . Neck: no obvious mass . Cardiovascular: S1 S2 normal no gallop . Respiratory: No rales or rhonchi noted . Abdomen: soft . Skin: no rash seen on limited exam . Musculoskeletal: not rigid . Psychiatric:unable to assess . Neurologic: no seizure no involuntary movements         Lab Data:   Basic Metabolic Panel: Recent Labs  Lab 08/09/18 0103 08/09/18 1311 08/10/18 0036 08/10/18 1139 08/11/18 1146  NA 140 140 142 140 139  K 5.1 4.0 3.9 3.5 3.3*  CL 106 106 106 109 108  CO2 23 25 23  20* 22  GLUCOSE 164* 167* 157* 139* 162*  BUN 37* 41* 33* 30* 25*  CREATININE 1.59* 1.73* 1.34* 1.16 1.03  CALCIUM 8.7* 8.6* 8.6* 8.5* 8.4*  MG 2.0 2.0 1.8 1.7 2.1  PHOS 3.7 3.7 2.9 2.8 2.2*    ABG: Recent Labs  Lab 08/08/18 1528 08/10/18 0511  PHART 7.358 7.411  PCO2ART 42.6 39.4  PO2ART 143* 102  HCO3 23.3 24.5  O2SAT 98.7 97.8    Liver Function Tests: Recent Labs  Lab 08/09/18 0103 08/09/18 1311 08/10/18 0036 08/10/18 1139 08/11/18 1146  ALBUMIN 2.4* 2.3* 2.4* 2.1* 2.1*   No results for input(s): LIPASE, AMYLASE in the last 168 hours. No results for input(s): AMMONIA in the last 168 hours.  CBC: Recent Labs  Lab 08/09/18 0103 08/09/18 1311 08/10/18 0036 08/10/18 1037  08/10/18 1139 08/11/18 1146  WBC 6.1 6.8 4.6  --  4.9 4.1  HGB 9.9* 9.1* 7.1* 10.4* 9.0* 8.7*  HCT 29.9* 28.7* 21.4* 33.1* 27.1* 26.1*  MCV 101.4* 104.4* 100.9*  --  98.2 99.6  PLT 185 153 148*  --  121* 120*    Cardiac Enzymes: Recent Labs  Lab 08/08/18 2141 08/09/18 1311 08/10/18 0036 08/10/18 1139  CKTOTAL 79 111 89 67    BNP (last 3 results) No results for input(s): BNP in the last 8760 hours.  ProBNP (last 3 results) No results for input(s): PROBNP in the last 8760 hours.  Radiological Exams: Dg Chest Port 1 View  Result Date: 08/10/2018 CLINICAL DATA:  Self extubation EXAM: PORTABLE CHEST 1 VIEW COMPARISON:  Two days ago FINDINGS: Improved positioning of enteric tube with tip over the proximal stomach. Interval extubation of the trachea. Continued bandlike opacity at the right base partially obscuring the diaphragm. No edema, effusion, or pneumothorax. Normal heart size and aortic contours. IMPRESSION: Tracheal extubation with unchanged right base atelectasis. Electronically Signed   By: Monte Fantasia M.D.   On: 08/10/2018 07:02    Assessment/Plan Active Problems:   Acute on chronic respiratory failure with hypoxia (HCC)   Healthcare-associated pneumonia   Chronic osteomyelitis of thoracic spine (HCC)   Bradycardia   Chronic kidney disease, stage III (moderate) (HCC)   COPD,  severe (HCC)   1. Acute on chronic respiratory failure with hypoxia patient is doing well on room air with no fever or distress noted.  We will continue supportive measures and pulmonary toilet at this time. 2. Healthcare associated pneumonia treated with antibiotics will continue to follow. 3. 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 4. Bradycardia at admission, resolved at this time. 5. Acute on chronic kidney disease stage III continue to follow labs and supportive measures 6. COPD continue present management nebulizers as necessary   I  have personally seen and evaluated the patient, evaluated laboratory and imaging results, formulated the assessment and plan and placed orders. The Patient requires high complexity decision making for assessment and support.  Case was discussed on Rounds with the Respiratory Therapy Staff  Yevonne PaxSaadat A Khan, MD [facility] Pulmonary Critical Care Medicine Sleep Medicine

## 2018-08-12 DIAGNOSIS — M4624 Osteomyelitis of vertebra, thoracic region: Secondary | ICD-10-CM | POA: Diagnosis not present

## 2018-08-12 DIAGNOSIS — J9621 Acute and chronic respiratory failure with hypoxia: Secondary | ICD-10-CM | POA: Diagnosis not present

## 2018-08-12 DIAGNOSIS — R001 Bradycardia, unspecified: Secondary | ICD-10-CM | POA: Diagnosis not present

## 2018-08-12 DIAGNOSIS — N183 Chronic kidney disease, stage 3 (moderate): Secondary | ICD-10-CM | POA: Diagnosis not present

## 2018-08-12 LAB — BASIC METABOLIC PANEL
Anion gap: 12 (ref 5–15)
BUN: 22 mg/dL (ref 8–23)
CO2: 26 mmol/L (ref 22–32)
Calcium: 9.1 mg/dL (ref 8.9–10.3)
Chloride: 104 mmol/L (ref 98–111)
Creatinine, Ser: 1.13 mg/dL (ref 0.61–1.24)
GFR calc Af Amer: >60 mL/min (ref >60)
GFR calc non Af Amer: 57 mL/min — ABNORMAL LOW (ref >60)
Glucose, Bld: 124 mg/dL — ABNORMAL HIGH (ref 70–99)
Potassium: 4.2 mmol/L (ref 3.5–5.1)
Sodium: 142 mmol/L (ref 135–145)

## 2018-08-12 LAB — MAGNESIUM: Magnesium: 1.8 mg/dL (ref 1.7–2.4)

## 2018-08-12 LAB — PHOSPHORUS: Phosphorus: 1.9 mg/dL — ABNORMAL LOW (ref 2.5–4.6)

## 2018-08-12 LAB — LACTIC ACID, PLASMA: Lactic Acid, Venous: 3.3 mmol/L (ref 0.5–1.9)

## 2018-08-12 NOTE — Progress Notes (Signed)
Echocardiogram attempted by multiple sonographers and images unobtainable.  Echo order cancelled. Please contact the echo lab with questions.

## 2018-08-12 NOTE — Progress Notes (Addendum)
[facility]   PULMONARY CRITICAL CARE SERVICE  PROGRESS NOTE  Date of Service: 08/12/2018  Fernando DillsUthel Valdez  ZOX:[id number]  DOB: 07/27/1926   DOA: 07/25/2018  Referring Physician: Carron CurieAli Hijazi, MD  HPI: Fernando DillsUthel Valdez is a 85 y.o. male seen for follow up of Acute on Chronic Respiratory Failure. PT remains on room air at this time.  No acute distress is noted.   Medications: Reviewed on Rounds  Physical Exam:  Vitals: pulse 100, Resp 18, bp 153/88, Pulse 98, temp 98.0  Ventilator Settings Room air  . General: Comfortable at this time . Eyes: Grossly normal lids, irises & conjunctiva . ENT: grossly tongue is normal . Neck: no obvious mass . Cardiovascular: S1 S2 normal no gallop . Respiratory: no rales or ronchi noted. . Abdomen: soft . Skin: no rash seen on limited exam . Musculoskeletal: not rigid . Psychiatric:unable to assess . Neurologic: no seizure no involuntary movements         Lab Data:   Basic Metabolic Panel: Recent Labs  Lab 08/09/18 1311 08/10/18 0036 08/10/18 1139 08/11/18 1146 08/12/18 1123  NA 140 142 140 139 142  K 4.0 3.9 3.5 3.3* 4.2  CL 106 106 109 108 104  CO2 25 23 20* 22 26  GLUCOSE 167* 157* 139* 162* 124*  BUN 41* 33* 30* 25* 22  CREATININE 1.73* 1.34* 1.16 1.03 1.13  CALCIUM 8.6* 8.6* 8.5* 8.4* 9.1  MG 2.0 1.8 1.7 2.1 1.8  PHOS 3.7 2.9 2.8 2.2* 1.9*    ABG: Recent Labs  Lab 08/08/18 1528 08/10/18 0511  PHART 7.358 7.411  PCO2ART 42.6 39.4  PO2ART 143* 102  HCO3 23.3 24.5  O2SAT 98.7 97.8    Liver Function Tests: Recent Labs  Lab 08/09/18 0103 08/09/18 1311 08/10/18 0036 08/10/18 1139 08/11/18 1146  ALBUMIN 2.4* 2.3* 2.4* 2.1* 2.1*   No results for input(s): LIPASE, AMYLASE in the last 168 hours. No results for input(s): AMMONIA in the last 168 hours.  CBC: Recent Labs  Lab 08/09/18 0103 08/09/18 1311 08/10/18 0036 08/10/18 1037 08/10/18 1139 08/11/18 1146   WBC 6.1 6.8 4.6  --  4.9 4.1  HGB 9.9* 9.1* 7.1* 10.4* 9.0* 8.7*  HCT 29.9* 28.7* 21.4* 33.1* 27.1* 26.1*  MCV 101.4* 104.4* 100.9*  --  98.2 99.6  PLT 185 153 148*  --  121* 120*    Cardiac Enzymes: Recent Labs  Lab 08/08/18 2141 08/09/18 1311 08/10/18 0036 08/10/18 1139  CKTOTAL 79 111 89 67    BNP (last 3 results) No results for input(s): BNP in the last 8760 hours.  ProBNP (last 3 results) No results for input(s): PROBNP in the last 8760 hours.  Radiological Exams: No results found.  Assessment/Plan Active Problems:   Acute on chronic respiratory failure with hypoxia (HCC)   Healthcare-associated pneumonia   Chronic osteomyelitis of thoracic spine (HCC)   Bradycardia   Chronic kidney disease, stage III (moderate) (HCC)   COPD, severe (HCC)   1. Acute on chronic respiratory failure with hypoxia patient is doing well on room air with no fever or distress noted.  We will continue supportive measures and pulmonary toilet at this time. 2. Healthcare associated pneumonia treated with antibiotics will continue to follow. 3. 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 4. Bradycardia at admission, resolved at this time. 5. Acute on chronic kidney disease stage III continue to follow labs and supportive measures 6. COPD  continue present management nebulizers as necessary   I have personally seen and evaluated the patient, evaluated laboratory and imaging results, formulated the assessment and plan and placed orders. The Patient requires high complexity decision making for assessment and support.  Case was discussed on Rounds with the Respiratory Therapy Staff  Allyne Gee, MD [facility] Pulmonary Critical Care Medicine Sleep Medicine

## 2018-08-13 LAB — RENAL FUNCTION PANEL
Albumin: 2.5 g/dL — ABNORMAL LOW (ref 3.5–5.0)
Anion gap: 11 (ref 5–15)
BUN: 22 mg/dL (ref 8–23)
CO2: 26 mmol/L (ref 22–32)
Calcium: 8.9 mg/dL (ref 8.9–10.3)
Chloride: 99 mmol/L (ref 98–111)
Creatinine, Ser: 0.89 mg/dL (ref 0.61–1.24)
GFR calc Af Amer: >60 mL/min (ref >60)
GFR calc non Af Amer: >60 mL/min (ref >60)
Glucose, Bld: 128 mg/dL — ABNORMAL HIGH (ref 70–99)
Phosphorus: 2.5 mg/dL (ref 2.5–4.6)
Potassium: 3.9 mmol/L (ref 3.5–5.1)
Sodium: 136 mmol/L (ref 135–145)

## 2018-08-13 LAB — BLOOD GAS, ARTERIAL
Acid-Base Excess: 4.9 mmol/L — ABNORMAL HIGH (ref 0.0–2.0)
Bicarbonate: 28.6 mmol/L — ABNORMAL HIGH (ref 20.0–28.0)
FIO2: 21
O2 Saturation: 98.5 %
Patient temperature: 97.2
pCO2 arterial: 39 mmHg (ref 32.0–48.0)
pH, Arterial: 7.475 — ABNORMAL HIGH (ref 7.350–7.450)
pO2, Arterial: 115 mmHg — ABNORMAL HIGH (ref 83.0–108.0)

## 2018-08-13 LAB — CULTURE, RESPIRATORY W GRAM STAIN

## 2018-08-13 LAB — MAGNESIUM: Magnesium: 1.7 mg/dL (ref 1.7–2.4)

## 2018-08-13 LAB — LACTIC ACID, PLASMA: Lactic Acid, Venous: 2.4 mmol/L (ref 0.5–1.9)

## 2018-08-14 LAB — CBC
HCT: 37.1 % — ABNORMAL LOW (ref 39.0–52.0)
HCT: 35.2 % — ABNORMAL LOW (ref 39.0–52.0)
Hemoglobin: 12.3 g/dL — ABNORMAL LOW (ref 13.0–17.0)
Hemoglobin: 11.6 g/dL — ABNORMAL LOW (ref 13.0–17.0)
MCH: 32.5 pg (ref 26.0–34.0)
MCH: 32.7 pg (ref 26.0–34.0)
MCHC: 33.2 g/dL (ref 30.0–36.0)
MCHC: 33 g/dL (ref 30.0–36.0)
MCV: 98.1 fL (ref 80.0–100.0)
MCV: 99.2 fL (ref 80.0–100.0)
Platelets: 155 10*3/uL (ref 150–400)
Platelets: 147 10*3/uL — ABNORMAL LOW (ref 150–400)
RBC: 3.78 MIL/uL — ABNORMAL LOW (ref 4.22–5.81)
RBC: 3.55 MIL/uL — ABNORMAL LOW (ref 4.22–5.81)
RDW: 17.9 % — ABNORMAL HIGH (ref 11.5–15.5)
RDW: 17.4 % — ABNORMAL HIGH (ref 11.5–15.5)
WBC: 6.7 10*3/uL (ref 4.0–10.5)
WBC: 6.9 10*3/uL (ref 4.0–10.5)
nRBC: 0 % (ref 0.0–0.2)
nRBC: 0 % (ref 0.0–0.2)

## 2018-08-14 LAB — CULTURE, BLOOD (ROUTINE X 2)
Culture: NO GROWTH
Culture: NO GROWTH
Special Requests: ADEQUATE

## 2018-08-14 LAB — RENAL FUNCTION PANEL
Albumin: 2.3 g/dL — ABNORMAL LOW (ref 3.5–5.0)
Anion gap: 10 (ref 5–15)
BUN: 29 mg/dL — ABNORMAL HIGH (ref 8–23)
CO2: 27 mmol/L (ref 22–32)
Calcium: 9 mg/dL (ref 8.9–10.3)
Chloride: 99 mmol/L (ref 98–111)
Creatinine, Ser: 1.01 mg/dL (ref 0.61–1.24)
GFR calc Af Amer: >60 mL/min (ref >60)
GFR calc non Af Amer: >60 mL/min (ref >60)
Glucose, Bld: 149 mg/dL — ABNORMAL HIGH (ref 70–99)
Phosphorus: 3.7 mg/dL (ref 2.5–4.6)
Potassium: 4.5 mmol/L (ref 3.5–5.1)
Sodium: 136 mmol/L (ref 135–145)

## 2018-08-14 LAB — LACTIC ACID, PLASMA: Lactic Acid, Venous: 2.1 mmol/L (ref 0.5–1.9)

## 2018-08-14 LAB — MAGNESIUM: Magnesium: 2.1 mg/dL (ref 1.7–2.4)

## 2018-08-15 LAB — BLOOD GAS, ARTERIAL
Acid-base deficit: 1.4 mmol/L (ref 0.0–2.0)
Bicarbonate: 23.3 mmol/L (ref 20.0–28.0)
FIO2: 50
MECHVT: 500 mL
O2 Saturation: 98.7 %
PEEP: 5 cmH2O
Patient temperature: 98.6
RATE: 12 resp/min
pCO2 arterial: 42.6 mmHg (ref 32.0–48.0)
pH, Arterial: 7.358 (ref 7.350–7.450)
pO2, Arterial: 143 mmHg — ABNORMAL HIGH (ref 83.0–108.0)

## 2018-08-15 LAB — RENAL FUNCTION PANEL
Albumin: 2.1 g/dL — ABNORMAL LOW (ref 3.5–5.0)
Anion gap: 9 (ref 5–15)
BUN: 29 mg/dL — ABNORMAL HIGH (ref 8–23)
CO2: 27 mmol/L (ref 22–32)
Calcium: 8.6 mg/dL — ABNORMAL LOW (ref 8.9–10.3)
Chloride: 101 mmol/L (ref 98–111)
Creatinine, Ser: 1.02 mg/dL (ref 0.61–1.24)
GFR calc Af Amer: >60 mL/min (ref >60)
GFR calc non Af Amer: >60 mL/min (ref >60)
Glucose, Bld: 118 mg/dL — ABNORMAL HIGH (ref 70–99)
Phosphorus: 2.4 mg/dL — ABNORMAL LOW (ref 2.5–4.6)
Potassium: 3.8 mmol/L (ref 3.5–5.1)
Sodium: 137 mmol/L (ref 135–145)

## 2018-08-15 LAB — MAGNESIUM: Magnesium: 1.7 mg/dL (ref 1.7–2.4)

## 2018-08-15 LAB — LACTIC ACID, PLASMA: Lactic Acid, Venous: 1.3 mmol/L (ref 0.5–1.9)

## 2018-08-16 LAB — URINALYSIS, ROUTINE W REFLEX MICROSCOPIC
Bilirubin Urine: NEGATIVE
Glucose, UA: 150 mg/dL — AB
Ketones, ur: NEGATIVE mg/dL
Nitrite: NEGATIVE
Protein, ur: 30 mg/dL — AB
RBC / HPF: >50 RBC/hpf — ABNORMAL HIGH (ref 0–5)
Specific Gravity, Urine: 1.015 (ref 1.005–1.030)
pH: 7 (ref 5.0–8.0)

## 2018-08-16 LAB — VANCOMYCIN, TROUGH: Vancomycin Tr: 11 ug/mL — ABNORMAL LOW (ref 15–20)

## 2018-08-16 LAB — MAGNESIUM: Magnesium: 2 mg/dL (ref 1.7–2.4)

## 2018-08-17 ENCOUNTER — Other Ambulatory Visit (HOSPITAL_COMMUNITY): Payer: Self-pay

## 2018-08-17 LAB — CBC
HCT: 30.4 % — ABNORMAL LOW (ref 39.0–52.0)
Hemoglobin: 10.1 g/dL — ABNORMAL LOW (ref 13.0–17.0)
MCH: 32.7 pg (ref 26.0–34.0)
MCHC: 33.2 g/dL (ref 30.0–36.0)
MCV: 98.4 fL (ref 80.0–100.0)
Platelets: 136 10*3/uL — ABNORMAL LOW (ref 150–400)
RBC: 3.09 MIL/uL — ABNORMAL LOW (ref 4.22–5.81)
RDW: 17.6 % — ABNORMAL HIGH (ref 11.5–15.5)
WBC: 8.2 10*3/uL (ref 4.0–10.5)
nRBC: 0 % (ref 0.0–0.2)

## 2018-08-17 LAB — BASIC METABOLIC PANEL
Anion gap: 11 (ref 5–15)
BUN: 39 mg/dL — ABNORMAL HIGH (ref 8–23)
CO2: 26 mmol/L (ref 22–32)
Calcium: 8.9 mg/dL (ref 8.9–10.3)
Chloride: 96 mmol/L — ABNORMAL LOW (ref 98–111)
Creatinine, Ser: 1.09 mg/dL (ref 0.61–1.24)
GFR calc Af Amer: >60 mL/min (ref >60)
GFR calc non Af Amer: 60 mL/min — ABNORMAL LOW (ref >60)
Glucose, Bld: 215 mg/dL — ABNORMAL HIGH (ref 70–99)
Potassium: 4 mmol/L (ref 3.5–5.1)
Sodium: 133 mmol/L — ABNORMAL LOW (ref 135–145)

## 2018-08-17 LAB — URINE CULTURE: Culture: <10000 — AB

## 2018-08-17 LAB — MAGNESIUM: Magnesium: 2 mg/dL (ref 1.7–2.4)

## 2018-08-17 LAB — VANCOMYCIN, TROUGH: Vancomycin Tr: 13 ug/mL — ABNORMAL LOW (ref 15–20)

## 2018-08-17 NOTE — Progress Notes (Signed)
IR received order for perc G-tube placement. CT abdomen to assess for candidacy performed today.  Per review of CT imaging with Dr. Vernard Gambles, there are multiple loops of bowel lying anterior over the stomach. There is no window for percutaneous G-tube placement  Consider surgical consultation for placement.  Ascencion Dike PA-C Interventional Radiology 08/17/2018 12:08 PM

## 2018-08-19 ENCOUNTER — Other Ambulatory Visit (HOSPITAL_COMMUNITY): Payer: Self-pay

## 2018-08-19 LAB — BASIC METABOLIC PANEL
Anion gap: 12 (ref 5–15)
BUN: 49 mg/dL — ABNORMAL HIGH (ref 8–23)
CO2: 25 mmol/L (ref 22–32)
Calcium: 8.5 mg/dL — ABNORMAL LOW (ref 8.9–10.3)
Chloride: 102 mmol/L (ref 98–111)
Creatinine, Ser: 1.12 mg/dL (ref 0.61–1.24)
GFR calc Af Amer: >60 mL/min (ref >60)
GFR calc non Af Amer: 58 mL/min — ABNORMAL LOW (ref >60)
Glucose, Bld: 140 mg/dL — ABNORMAL HIGH (ref 70–99)
Potassium: 3.5 mmol/L (ref 3.5–5.1)
Sodium: 139 mmol/L (ref 135–145)

## 2018-08-20 ENCOUNTER — Other Ambulatory Visit (HOSPITAL_COMMUNITY): Payer: Self-pay

## 2018-08-20 LAB — BLOOD GAS, ARTERIAL
Acid-Base Excess: 4.6 mmol/L — ABNORMAL HIGH (ref 0.0–2.0)
Bicarbonate: 28.9 mmol/L — ABNORMAL HIGH (ref 20.0–28.0)
O2 Content: 2 L/min
O2 Saturation: 92.9 %
Patient temperature: 98.6
pCO2 arterial: 45.9 mmHg (ref 32.0–48.0)
pH, Arterial: 7.416 (ref 7.350–7.450)
pO2, Arterial: 70.1 mmHg — ABNORMAL LOW (ref 83.0–108.0)

## 2018-08-20 LAB — VANCOMYCIN, TROUGH: Vancomycin Tr: 20 ug/mL (ref 15–20)

## 2018-08-21 ENCOUNTER — Other Ambulatory Visit (HOSPITAL_COMMUNITY): Payer: Self-pay

## 2018-08-21 LAB — RENAL FUNCTION PANEL
Albumin: 1.6 g/dL — ABNORMAL LOW (ref 3.5–5.0)
Anion gap: 8 (ref 5–15)
BUN: 41 mg/dL — ABNORMAL HIGH (ref 8–23)
CO2: 28 mmol/L (ref 22–32)
Calcium: 8.3 mg/dL — ABNORMAL LOW (ref 8.9–10.3)
Chloride: 102 mmol/L (ref 98–111)
Creatinine, Ser: 1.06 mg/dL (ref 0.61–1.24)
GFR calc Af Amer: >60 mL/min (ref >60)
GFR calc non Af Amer: >60 mL/min (ref >60)
Glucose, Bld: 144 mg/dL — ABNORMAL HIGH (ref 70–99)
Phosphorus: 3.7 mg/dL (ref 2.5–4.6)
Potassium: 3.3 mmol/L — ABNORMAL LOW (ref 3.5–5.1)
Sodium: 138 mmol/L (ref 135–145)

## 2018-08-21 LAB — CBC
HCT: 26.2 % — ABNORMAL LOW (ref 39.0–52.0)
Hemoglobin: 8.1 g/dL — ABNORMAL LOW (ref 13.0–17.0)
MCH: 32 pg (ref 26.0–34.0)
MCHC: 30.9 g/dL (ref 30.0–36.0)
MCV: 103.6 fL — ABNORMAL HIGH (ref 80.0–100.0)
Platelets: 169 10*3/uL (ref 150–400)
RBC: 2.53 MIL/uL — ABNORMAL LOW (ref 4.22–5.81)
RDW: 17.6 % — ABNORMAL HIGH (ref 11.5–15.5)
WBC: 8.6 10*3/uL (ref 4.0–10.5)
nRBC: 0 % (ref 0.0–0.2)

## 2018-08-21 LAB — EXPECTORATED SPUTUM ASSESSMENT W GRAM STAIN, RFLX TO RESP C

## 2018-08-21 LAB — MAGNESIUM: Magnesium: 2.1 mg/dL (ref 1.7–2.4)

## 2018-08-22 ENCOUNTER — Institutional Professional Consult (permissible substitution) (HOSPITAL_COMMUNITY): Payer: Self-pay

## 2018-08-22 LAB — SARS CORONAVIRUS 2 BY RT PCR (HOSPITAL ORDER, PERFORMED IN ~~LOC~~ HOSPITAL LAB): SARS Coronavirus 2: NEGATIVE

## 2018-08-22 LAB — POTASSIUM: Potassium: 3.9 mmol/L (ref 3.5–5.1)

## 2018-08-25 LAB — CULTURE, RESPIRATORY W GRAM STAIN

## 2018-09-06 DEATH — deceased

## 2021-07-28 IMAGING — CT CT ABDOMEN WITHOUT CONTRAST
2 of 4 series · 17 of 46 positions shown, 19 images · non-contrast
Comparison: None.

CLINICAL DATA: Respiratory failure, preop planning for gastrostomy
placement

EXAM:
CT ABDOMEN WITHOUT CONTRAST
TECHNIQUE: Multidetector CT imaging of the abdomen was performed following the
standard protocol without IV contrast.

[Series 3: abd w/o 5.0 i30f 2 · axial · non-contrast · 0.72mm/px · z∈[+900,+1150]mm · 14 of 56 slices shown, 16 images]
[im 3/56  soft-tissue]
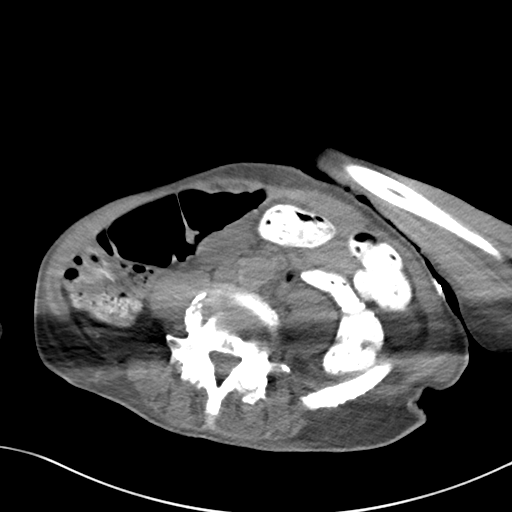
[im 3/56  bone]
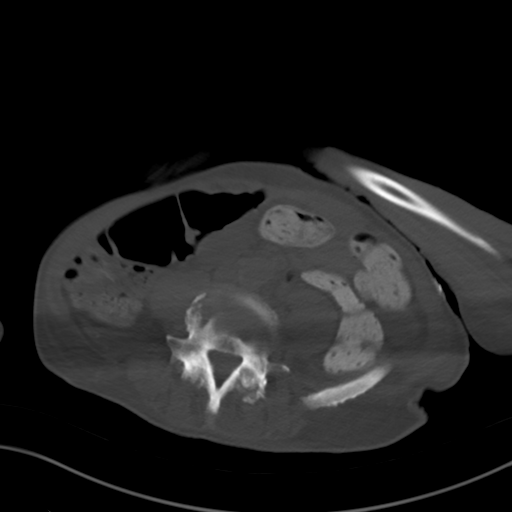
[im 8/56  soft-tissue]
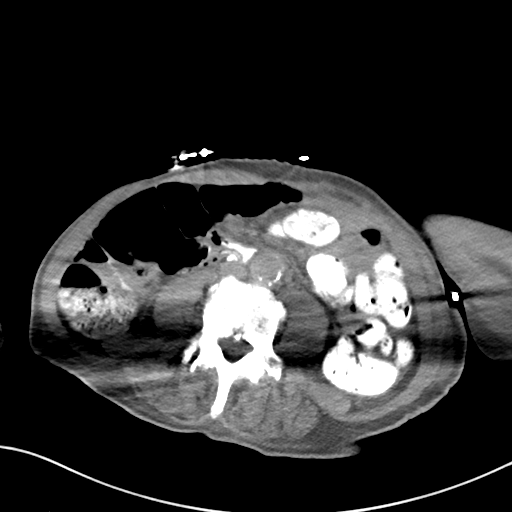
[im 11/56  soft-tissue]
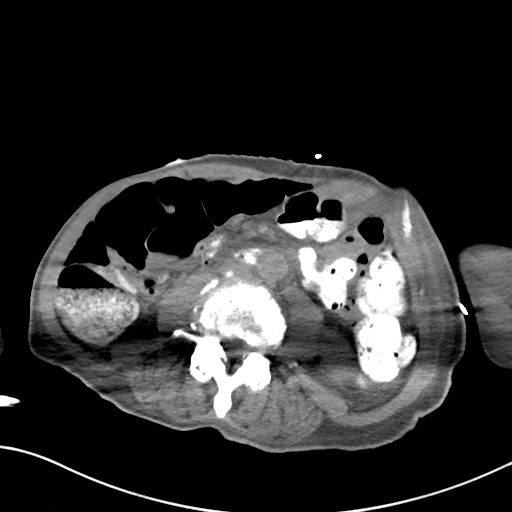
[im 16/56  soft-tissue]
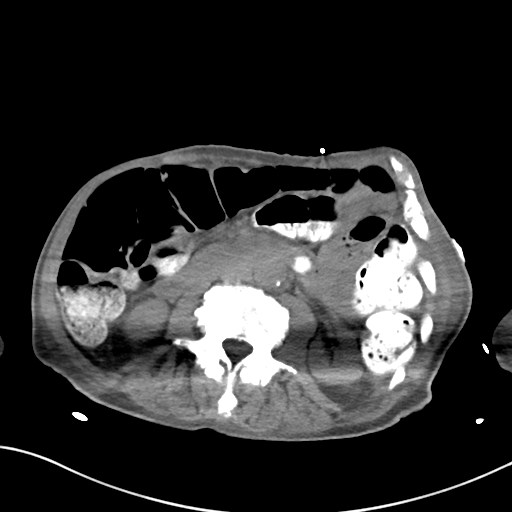
[im 19/56  soft-tissue]
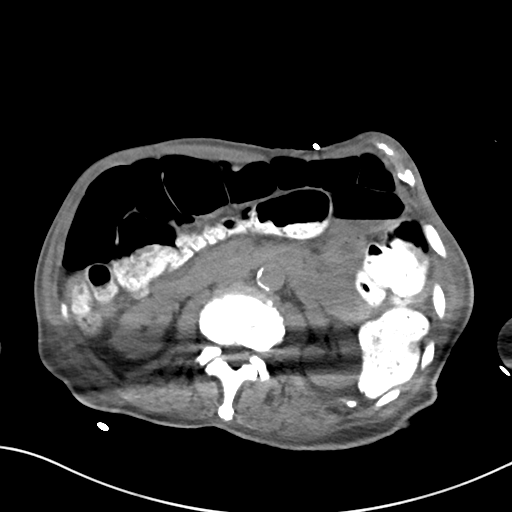
[im 21/56  soft-tissue]
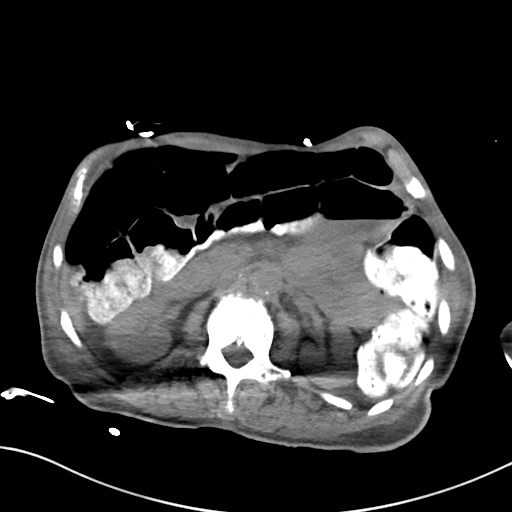
[im 27/56  soft-tissue]
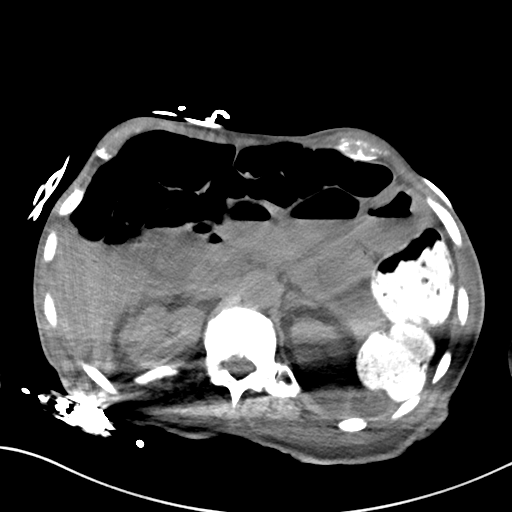
[im 29/56  soft-tissue]
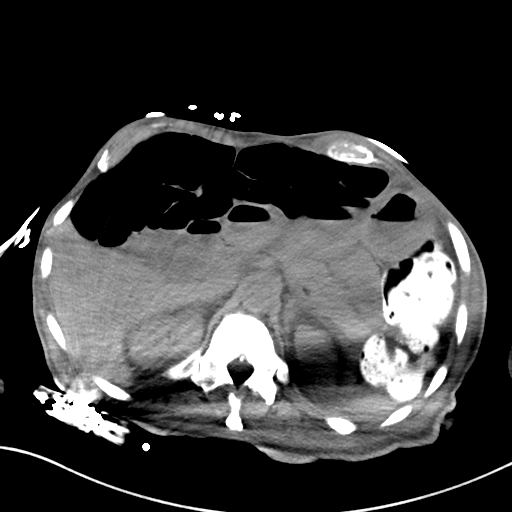
[im 35/56  soft-tissue]
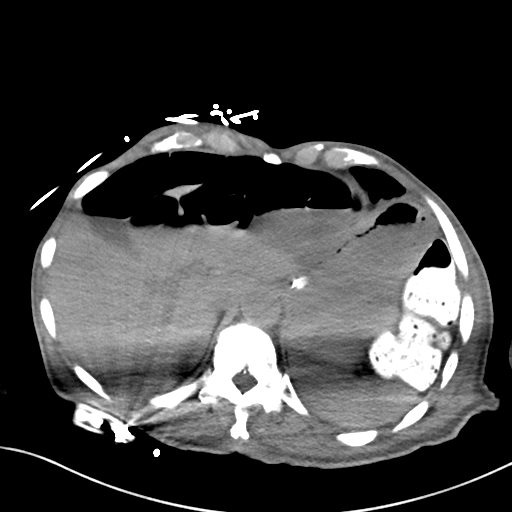
[im 35/56  bone]
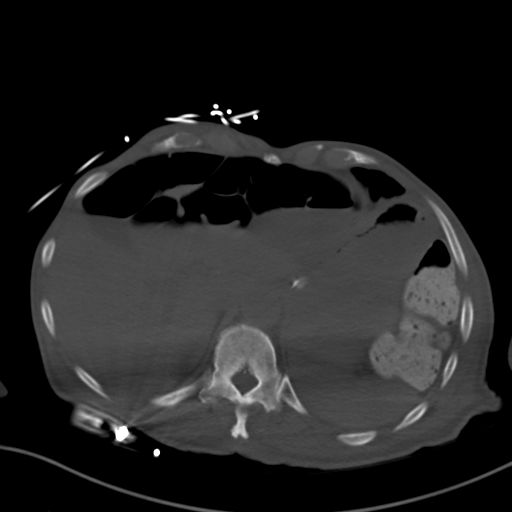
[im 37/56  soft-tissue]
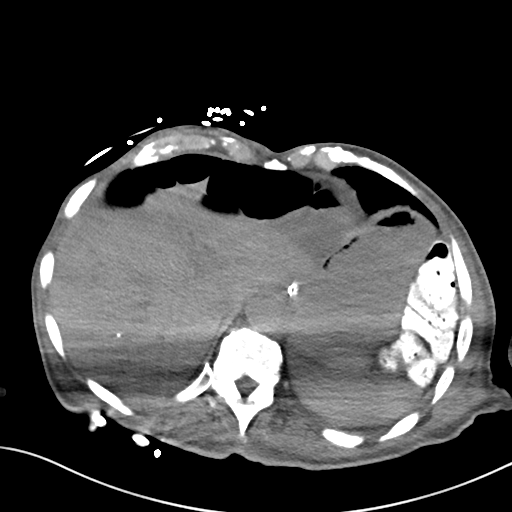
[im 42/56  soft-tissue]
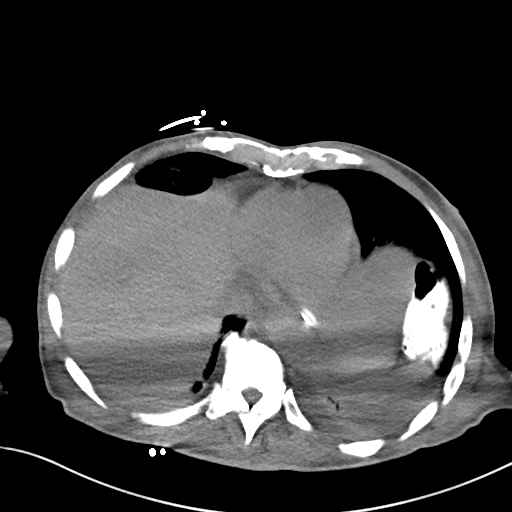
[im 45/56  soft-tissue]
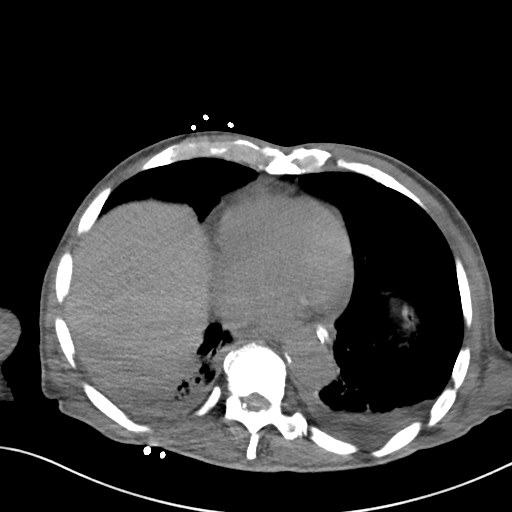
[im 48/56  soft-tissue]
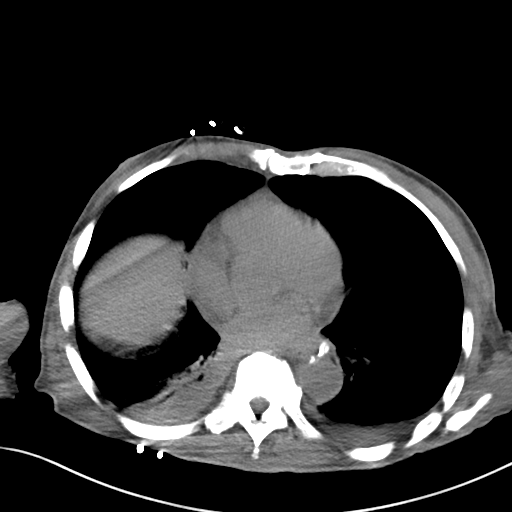
[im 53/56  soft-tissue]
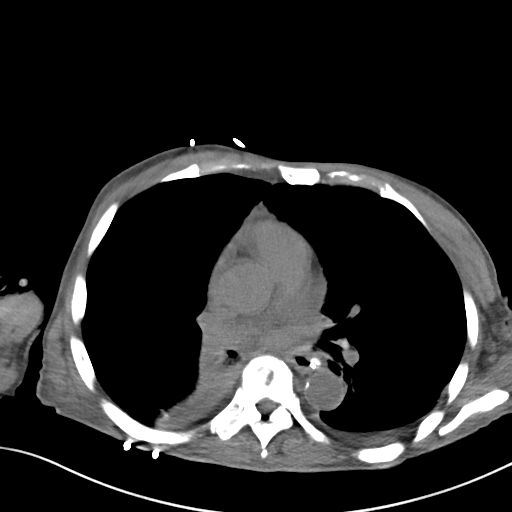

[Series 6: cor st · coronal · 0.61mm/px · 3 of 91 slices shown]
[im 31/91  soft-tissue]
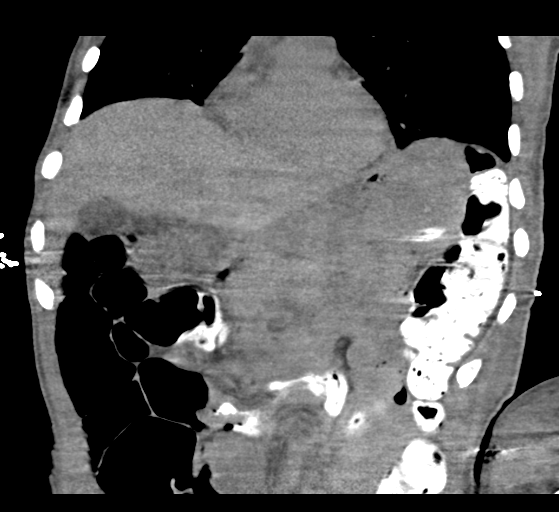
[im 41/91  soft-tissue]
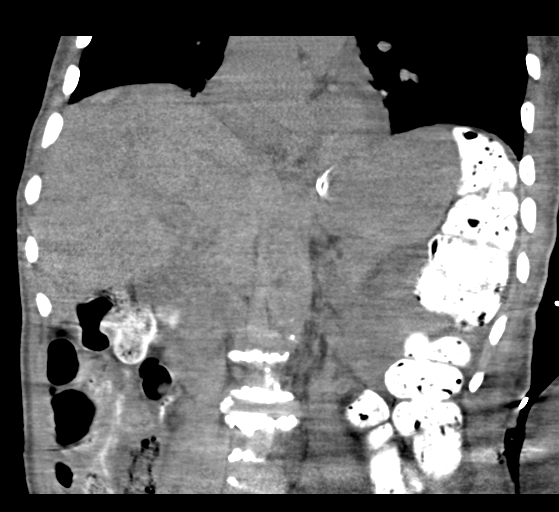
[im 51/91  soft-tissue]
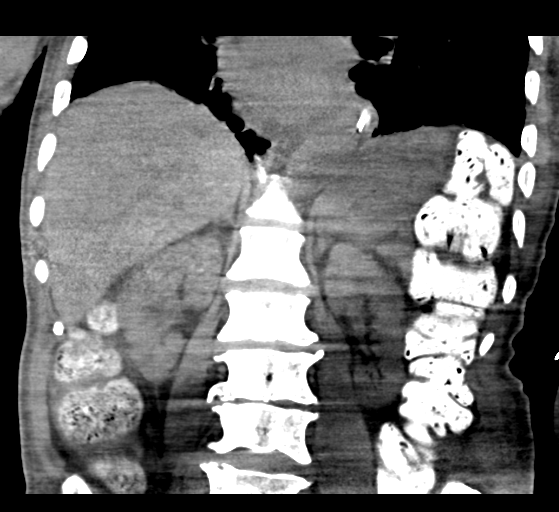

[17 of 46 positions shown; findings below may reference images not displayed]

FINDINGS: Lower chest: Trace pleural effusions left greater than right.
Airspace consolidation in the posterior right lower lobe.
Atelectasis/consolidation in the posterior left lower lobe.

Hepatobiliary: No focal liver abnormality is seen. No gallstones,
gallbladder wall thickening, or biliary dilatation.

Pancreas: Unremarkable. No pancreatic ductal dilatation or
surrounding inflammatory changes.

Spleen: Normal in size without focal abnormality.

Adrenals/Urinary Tract: Adrenal glands are unremarkable. Kidneys are
normal, without renal calculi, focal lesion, or hydronephrosis.

Stomach/Bowel: Gastric tube extends to the fundus. The stomach is
incompletely distended. Multiple small bowel loops are interposed
between the stomach and anterior abdominal wall. Residual oral
contrast material in the nondilated colon.

Vascular/Lymphatic: Aortic Atherosclerosis ([id number]-170.0) without
aneurysm. No adenopathy identified.

Other: No ascites.  No free air.

Musculoskeletal: Spondylitic changes in the lower lumbar spine. No
fracture or worrisome bone lesion.
IMPRESSION: 1. Multiple small bowel loops interposed between stomach and
anterior abdominal wall with no percutaneous window evident for
gastrostomy placement. Consider surgical placement.
2. Bibasilar airspace consolidation/atelectasis, right worse than
left, with trace pleural effusions.

## 2021-07-31 IMAGING — DX PORTABLE CHEST - 1 VIEW
1 series · 1 of 1 positions shown · non-contrast
Comparison: Chest radiograph 08/10/2018, CT 08/17/2018

CLINICAL DATA: pneumonia

EXAM:
PORTABLE CHEST 1 VIEW

[chest ap]
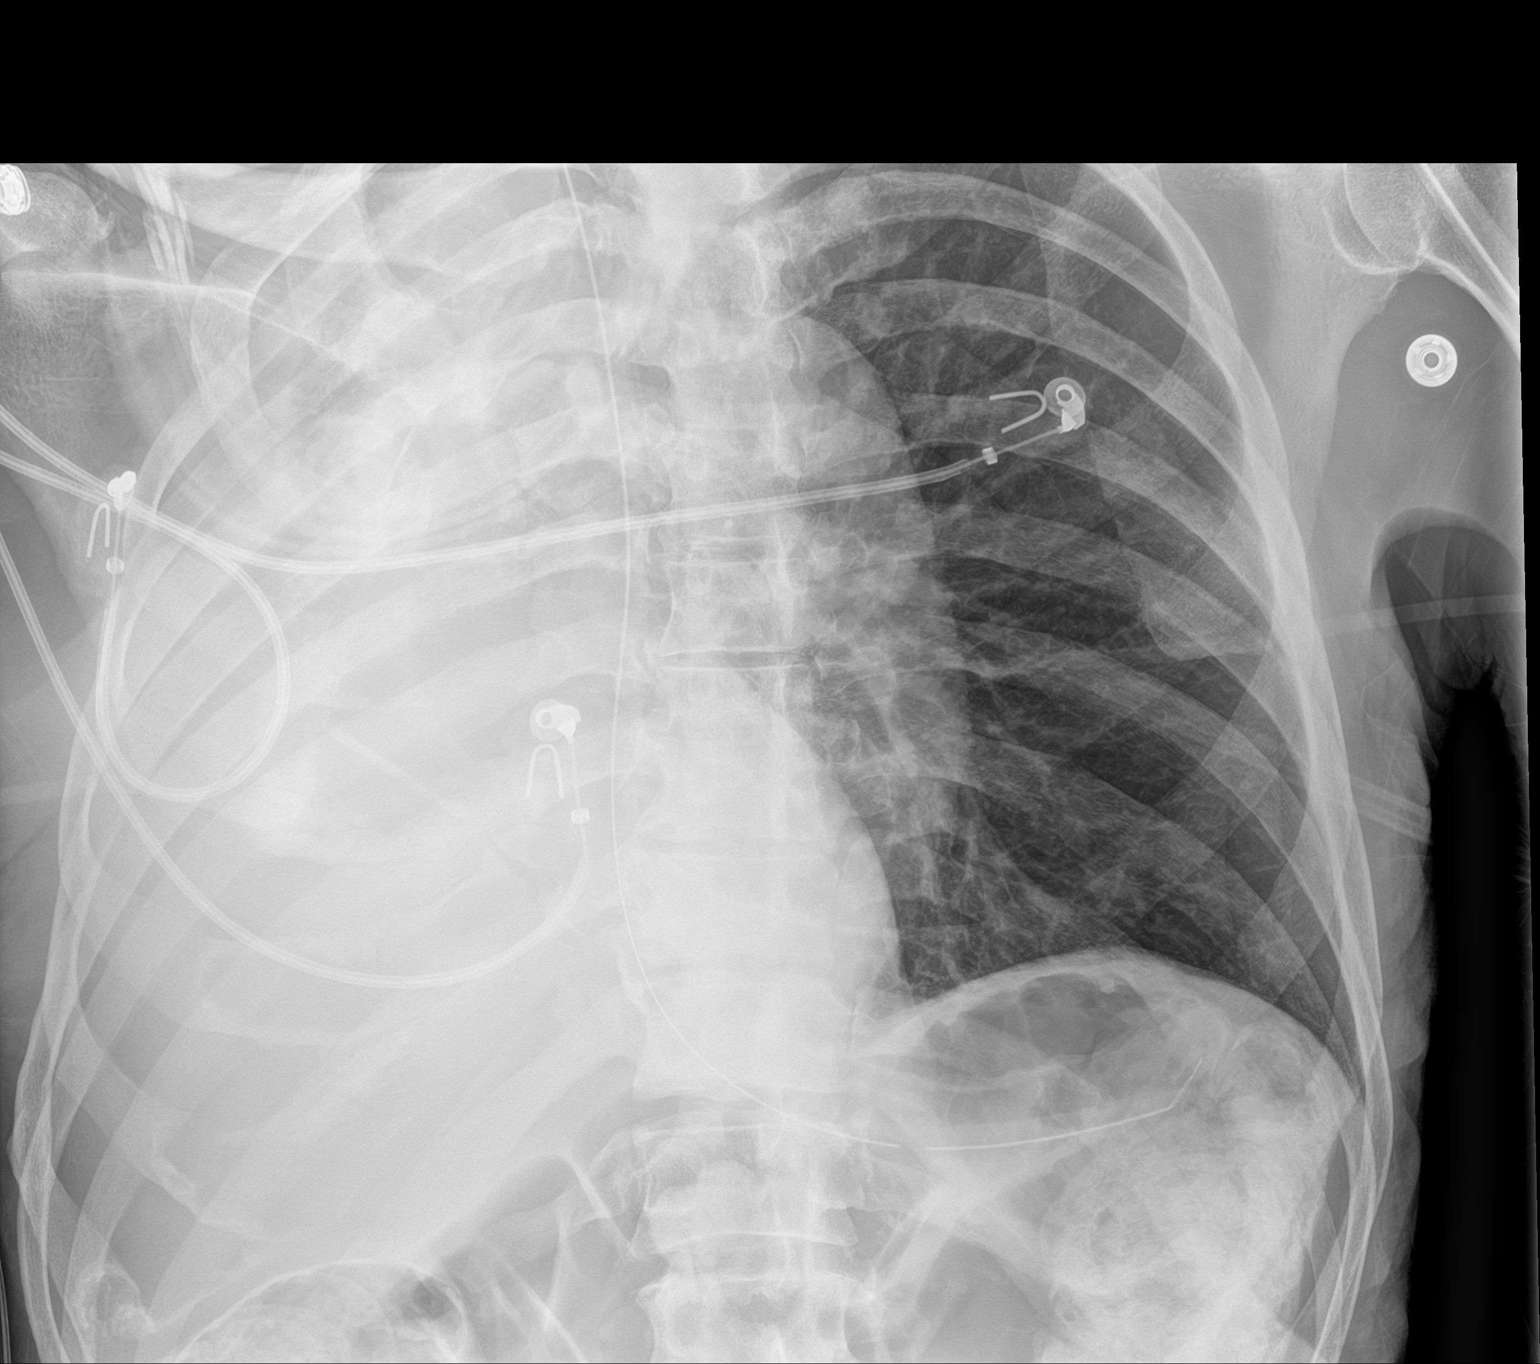

[1 of 1 positions shown; findings below may reference images not displayed]

FINDINGS: NG tube extends inot the stomach.

There is new complete opacification of the RIGHT hemithorax. LEFT
lung clear. No pneumothorax. Some mediastinal shift to the RIGHT
suggest volume loss.
IMPRESSION: 1. New complete opacification of the RIGHT hemithorax compared to
08/10/2018. Findings suggest mucous plugging RIGHT mainstem bronchus
with collapse. Recommend pulmonary consultation.
2. LEFT lung clear.

These results will be called to the ordering clinician or
representative by the Radiologist Assistant, and communication
documented in the PACS or [employer] Dashboard.

## 2021-08-02 IMAGING — DX PORTABLE CHEST - 1 VIEW
1 series · 1 of 1 positions shown · non-contrast
Comparison: Yesterday

CLINICAL DATA: Lung abnormality

EXAM:
PORTABLE CHEST 1 VIEW

[chest]
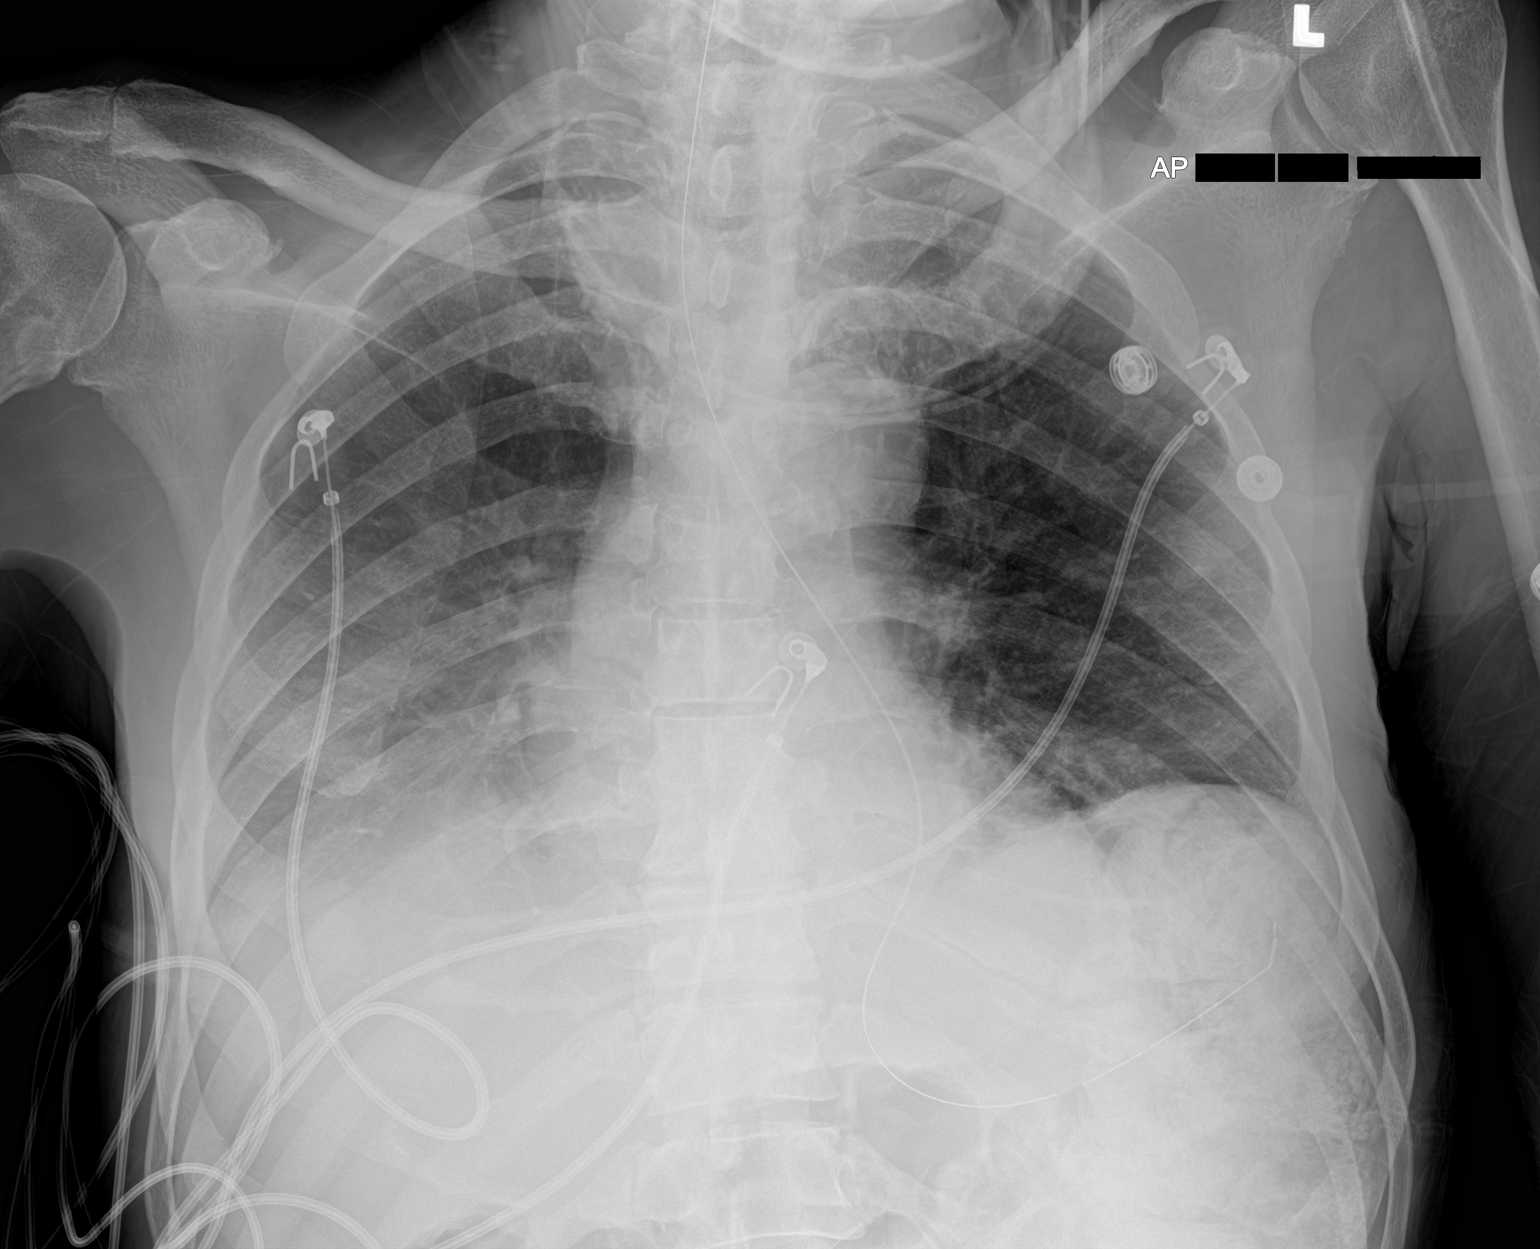

[1 of 1 positions shown; findings below may reference images not displayed]

FINDINGS: Continued improvement in right lung aeration. There is still hazy
right mid lower chest opacification likely from residual
atelectasis. Mild streaky density at the left base. The enteric tube
tip is at the proximal stomach.

Normal heart size.
IMPRESSION: Continued improvement in right lung aeration. Lung volumes are low
and there is atelectasis or pneumonia both bases.
